# Patient Record
Sex: Female | Born: 1960 | Race: White | Hispanic: No | Marital: Married | Smoking: Never smoker
Health system: Southern US, Community
[De-identification: ages and names within clinical notes are randomized; demographics above are authoritative.]

## PROBLEM LIST (undated history)

## (undated) DIAGNOSIS — T7840XA Allergy, unspecified, initial encounter: Secondary | ICD-10-CM

## (undated) DIAGNOSIS — C801 Malignant (primary) neoplasm, unspecified: Secondary | ICD-10-CM

## (undated) DIAGNOSIS — E785 Hyperlipidemia, unspecified: Secondary | ICD-10-CM

## (undated) DIAGNOSIS — Z923 Personal history of irradiation: Secondary | ICD-10-CM

## (undated) DIAGNOSIS — Z9221 Personal history of antineoplastic chemotherapy: Secondary | ICD-10-CM

## (undated) DIAGNOSIS — Z01419 Encounter for gynecological examination (general) (routine) without abnormal findings: Secondary | ICD-10-CM

## (undated) DIAGNOSIS — C439 Malignant melanoma of skin, unspecified: Secondary | ICD-10-CM

## (undated) HISTORY — DX: Malignant (primary) neoplasm, unspecified: C80.1

## (undated) HISTORY — DX: Hyperlipidemia, unspecified: E78.5

## (undated) HISTORY — PX: BREAST LUMPECTOMY: SHX2

## (undated) HISTORY — PX: MELANOMA EXCISION: SHX5266

## (undated) HISTORY — DX: Encounter for gynecological examination (general) (routine) without abnormal findings: Z01.419

## (undated) HISTORY — DX: Allergy, unspecified, initial encounter: T78.40XA

## (undated) HISTORY — PX: POLYPECTOMY: SHX149

---
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
## 2008-12-30 ENCOUNTER — Ambulatory Visit: Payer: Self-pay | Admitting: Oncology

## 2008-12-31 ENCOUNTER — Encounter: Admission: RE | Admit: 2008-12-31 | Discharge: 2008-12-31 | Payer: Self-pay | Admitting: Radiology

## 2008-12-31 LAB — CBC WITH DIFFERENTIAL/PLATELET
Basophils Absolute: 0 10*3/uL (ref 0.0–0.1)
EOS%: 1.8 % (ref 0.0–7.0)
HCT: 38.6 % (ref 34.8–46.6)
HGB: 13.2 g/dL (ref 11.6–15.9)
MCH: 31.5 pg (ref 25.1–34.0)
NEUT%: 74.4 % (ref 38.4–76.8)
Platelets: 299 10*3/uL (ref 145–400)
lymph#: 1.6 10*3/uL (ref 0.9–3.3)

## 2009-01-01 ENCOUNTER — Encounter: Payer: Self-pay | Admitting: Oncology

## 2009-01-01 ENCOUNTER — Ambulatory Visit: Admission: RE | Admit: 2009-01-01 | Discharge: 2009-01-01 | Payer: Self-pay | Admitting: Oncology

## 2009-01-01 LAB — LACTATE DEHYDROGENASE: LDH: 114 U/L (ref 94–250)

## 2009-01-01 LAB — VITAMIN D 25 HYDROXY (VIT D DEFICIENCY, FRACTURES): Vit D, 25-Hydroxy: 38 ng/mL (ref 30–89)

## 2009-01-01 LAB — COMPREHENSIVE METABOLIC PANEL
AST: 15 U/L (ref 0–37)
BUN: 15 mg/dL (ref 6–23)
CO2: 25 mEq/L (ref 19–32)
Calcium: 9.9 mg/dL (ref 8.4–10.5)
Chloride: 104 mEq/L (ref 96–112)
Creatinine, Ser: 0.97 mg/dL (ref 0.40–1.20)

## 2009-01-06 ENCOUNTER — Ambulatory Visit (HOSPITAL_COMMUNITY): Admission: RE | Admit: 2009-01-06 | Discharge: 2009-01-06 | Payer: Self-pay | Admitting: Oncology

## 2009-01-13 ENCOUNTER — Ambulatory Visit (HOSPITAL_COMMUNITY): Admission: RE | Admit: 2009-01-13 | Discharge: 2009-01-13 | Payer: Self-pay | Admitting: General Surgery

## 2009-01-26 ENCOUNTER — Ambulatory Visit: Payer: Self-pay | Admitting: Oncology

## 2009-01-28 LAB — MORPHOLOGY

## 2009-01-28 LAB — CBC WITH DIFFERENTIAL/PLATELET
BASO%: 2.9 % — ABNORMAL HIGH (ref 0.0–2.0)
Eosinophils Absolute: 0.2 10*3/uL (ref 0.0–0.5)
LYMPH%: 45.4 % (ref 14.0–49.7)
MCHC: 33.4 g/dL (ref 31.5–36.0)
MCV: 91.4 fL (ref 79.5–101.0)
MONO%: 3.4 % (ref 0.0–14.0)
NEUT#: 0.6 10*3/uL — ABNORMAL LOW (ref 1.5–6.5)
Platelets: 133 10*3/uL — ABNORMAL LOW (ref 145–400)
RBC: 4.19 10*6/uL (ref 3.70–5.45)
RDW: 12 % (ref 11.2–14.5)
WBC: 1.7 10*3/uL — ABNORMAL LOW (ref 3.9–10.3)

## 2009-02-04 LAB — CBC WITH DIFFERENTIAL/PLATELET
Basophils Absolute: 0.1 10*3/uL (ref 0.0–0.1)
Eosinophils Absolute: 0 10*3/uL (ref 0.0–0.5)
HCT: 38.4 % (ref 34.8–46.6)
HGB: 13 g/dL (ref 11.6–15.9)
LYMPH%: 18.8 % (ref 14.0–49.7)
MCH: 30.7 pg (ref 25.1–34.0)
MCV: 90.8 fL (ref 79.5–101.0)
MONO%: 7 % (ref 0.0–14.0)
NEUT#: 7.5 10*3/uL — ABNORMAL HIGH (ref 1.5–6.5)
NEUT%: 72.9 % (ref 38.4–76.8)
Platelets: 198 10*3/uL (ref 145–400)
RDW: 12.2 % (ref 11.2–14.5)

## 2009-02-04 LAB — COMPREHENSIVE METABOLIC PANEL
AST: 18 U/L (ref 0–37)
Alkaline Phosphatase: 136 U/L — ABNORMAL HIGH (ref 39–117)
BUN: 18 mg/dL (ref 6–23)
Creatinine, Ser: 0.97 mg/dL (ref 0.40–1.20)
Glucose, Bld: 95 mg/dL (ref 70–99)
Total Bilirubin: 0.3 mg/dL (ref 0.3–1.2)

## 2009-02-11 LAB — CBC WITH DIFFERENTIAL/PLATELET
BASO%: 3.8 % — ABNORMAL HIGH (ref 0.0–2.0)
Eosinophils Absolute: 0 10*3/uL (ref 0.0–0.5)
HCT: 33 % — ABNORMAL LOW (ref 34.8–46.6)
LYMPH%: 36.4 % (ref 14.0–49.7)
MCHC: 35.2 g/dL (ref 31.5–36.0)
MCV: 90.8 fL (ref 79.5–101.0)
MONO#: 0.1 10*3/uL (ref 0.1–0.9)
MONO%: 4.6 % (ref 0.0–14.0)
NEUT%: 54.9 % (ref 38.4–76.8)
Platelets: 149 10*3/uL (ref 145–400)
WBC: 1.6 10*3/uL — ABNORMAL LOW (ref 3.9–10.3)

## 2009-02-18 LAB — CBC WITH DIFFERENTIAL/PLATELET
BASO%: 0.1 % (ref 0.0–2.0)
EOS%: 0.2 % (ref 0.0–7.0)
HCT: 32.3 % — ABNORMAL LOW (ref 34.8–46.6)
LYMPH%: 11 % — ABNORMAL LOW (ref 14.0–49.7)
MCH: 31.8 pg (ref 25.1–34.0)
MCHC: 34.7 g/dL (ref 31.5–36.0)
NEUT%: 83 % — ABNORMAL HIGH (ref 38.4–76.8)
Platelets: 263 10*3/uL (ref 145–400)

## 2009-02-18 LAB — COMPREHENSIVE METABOLIC PANEL
ALT: 42 U/L — ABNORMAL HIGH (ref 0–35)
AST: 26 U/L (ref 0–37)
Alkaline Phosphatase: 160 U/L — ABNORMAL HIGH (ref 39–117)
Creatinine, Ser: 1.01 mg/dL (ref 0.40–1.20)
Total Bilirubin: 0.2 mg/dL — ABNORMAL LOW (ref 0.3–1.2)

## 2009-02-19 ENCOUNTER — Ambulatory Visit: Payer: Self-pay | Admitting: Oncology

## 2009-02-25 LAB — CBC WITH DIFFERENTIAL/PLATELET
Eosinophils Absolute: 0 10*3/uL (ref 0.0–0.5)
HCT: 30.6 % — ABNORMAL LOW (ref 34.8–46.6)
LYMPH%: 28.1 % (ref 14.0–49.7)
MCV: 91.4 fL (ref 79.5–101.0)
MONO#: 0 10*3/uL — ABNORMAL LOW (ref 0.1–0.9)
MONO%: 1.9 % (ref 0.0–14.0)
NEUT#: 1.1 10*3/uL — ABNORMAL LOW (ref 1.5–6.5)
NEUT%: 67.5 % (ref 38.4–76.8)
Platelets: 155 10*3/uL (ref 145–400)
WBC: 1.6 10*3/uL — ABNORMAL LOW (ref 3.9–10.3)

## 2009-03-04 LAB — CBC WITH DIFFERENTIAL/PLATELET
BASO%: 0.2 % (ref 0.0–2.0)
EOS%: 0.2 % (ref 0.0–7.0)
HCT: 31.1 % — ABNORMAL LOW (ref 34.8–46.6)
LYMPH%: 8.1 % — ABNORMAL LOW (ref 14.0–49.7)
MCH: 31.6 pg (ref 25.1–34.0)
MCHC: 34.3 g/dL (ref 31.5–36.0)
MCV: 92.1 fL (ref 79.5–101.0)
MONO#: 0.5 10*3/uL (ref 0.1–0.9)
MONO%: 3.1 % (ref 0.0–14.0)
NEUT%: 88.4 % — ABNORMAL HIGH (ref 38.4–76.8)
Platelets: 272 10*3/uL (ref 145–400)

## 2009-03-04 LAB — COMPREHENSIVE METABOLIC PANEL
ALT: 45 U/L — ABNORMAL HIGH (ref 0–35)
Alkaline Phosphatase: 158 U/L — ABNORMAL HIGH (ref 39–117)
CO2: 22 mEq/L (ref 19–32)
Creatinine, Ser: 0.88 mg/dL (ref 0.40–1.20)
Total Bilirubin: 0.3 mg/dL (ref 0.3–1.2)

## 2009-03-11 ENCOUNTER — Ambulatory Visit (HOSPITAL_COMMUNITY): Admission: RE | Admit: 2009-03-11 | Discharge: 2009-03-11 | Payer: Self-pay | Admitting: Oncology

## 2009-03-11 LAB — CBC WITH DIFFERENTIAL/PLATELET
BASO%: 2 % (ref 0.0–2.0)
Eosinophils Absolute: 0 10*3/uL (ref 0.0–0.5)
HCT: 26.9 % — ABNORMAL LOW (ref 34.8–46.6)
LYMPH%: 16.8 % (ref 14.0–49.7)
MCHC: 34.7 g/dL (ref 31.5–36.0)
MONO#: 0.1 10*3/uL (ref 0.1–0.9)
NEUT%: 78.2 % — ABNORMAL HIGH (ref 38.4–76.8)
Platelets: 162 10*3/uL (ref 145–400)
WBC: 2.5 10*3/uL — ABNORMAL LOW (ref 3.9–10.3)

## 2009-03-18 LAB — CBC WITH DIFFERENTIAL/PLATELET
BASO%: 0 % (ref 0.0–2.0)
EOS%: 0 % (ref 0.0–7.0)
HCT: 29.5 % — ABNORMAL LOW (ref 34.8–46.6)
LYMPH%: 2.5 % — ABNORMAL LOW (ref 14.0–49.7)
MCH: 32.7 pg (ref 25.1–34.0)
MCHC: 34.3 g/dL (ref 31.5–36.0)
MONO%: 3.1 % (ref 0.0–14.0)
NEUT%: 94.4 % — ABNORMAL HIGH (ref 38.4–76.8)
Platelets: 261 10*3/uL (ref 145–400)

## 2009-03-18 LAB — COMPREHENSIVE METABOLIC PANEL
ALT: 42 U/L — ABNORMAL HIGH (ref 0–35)
AST: 21 U/L (ref 0–37)
Creatinine, Ser: 0.93 mg/dL (ref 0.40–1.20)
Total Bilirubin: 0.3 mg/dL (ref 0.3–1.2)

## 2009-03-23 ENCOUNTER — Ambulatory Visit: Payer: Self-pay | Admitting: Oncology

## 2009-03-25 LAB — CBC WITH DIFFERENTIAL/PLATELET
Basophils Absolute: 0 10*3/uL (ref 0.0–0.1)
EOS%: 0.1 % (ref 0.0–7.0)
Eosinophils Absolute: 0 10*3/uL (ref 0.0–0.5)
HGB: 10.6 g/dL — ABNORMAL LOW (ref 11.6–15.9)
MCH: 32.8 pg (ref 25.1–34.0)
NEUT#: 42.5 10*3/uL — ABNORMAL HIGH (ref 1.5–6.5)
RBC: 3.22 10*6/uL — ABNORMAL LOW (ref 3.70–5.45)
RDW: 19.7 % — ABNORMAL HIGH (ref 11.2–14.5)
lymph#: 2.4 10*3/uL (ref 0.9–3.3)

## 2009-04-01 LAB — CBC WITH DIFFERENTIAL/PLATELET
Eosinophils Absolute: 0 10*3/uL (ref 0.0–0.5)
HGB: 10.5 g/dL — ABNORMAL LOW (ref 11.6–15.9)
MCV: 96 fL (ref 79.5–101.0)
MONO#: 0.4 10*3/uL (ref 0.1–0.9)
MONO%: 1.6 % (ref 0.0–14.0)
NEUT#: 25.1 10*3/uL — ABNORMAL HIGH (ref 1.5–6.5)
RBC: 3.22 10*6/uL — ABNORMAL LOW (ref 3.70–5.45)
RDW: 19.6 % — ABNORMAL HIGH (ref 11.2–14.5)
WBC: 26.3 10*3/uL — ABNORMAL HIGH (ref 3.9–10.3)
lymph#: 0.7 10*3/uL — ABNORMAL LOW (ref 0.9–3.3)

## 2009-04-01 LAB — COMPREHENSIVE METABOLIC PANEL
Albumin: 4.5 g/dL (ref 3.5–5.2)
Alkaline Phosphatase: 228 U/L — ABNORMAL HIGH (ref 39–117)
Calcium: 9.3 mg/dL (ref 8.4–10.5)
Chloride: 104 mEq/L (ref 96–112)
Glucose, Bld: 125 mg/dL — ABNORMAL HIGH (ref 70–99)
Potassium: 3.9 mEq/L (ref 3.5–5.3)
Sodium: 139 mEq/L (ref 135–145)
Total Protein: 6.8 g/dL (ref 6.0–8.3)

## 2009-04-08 LAB — CBC WITH DIFFERENTIAL/PLATELET
EOS%: 0.9 % (ref 0.0–7.0)
MCH: 33.1 pg (ref 25.1–34.0)
MCV: 95.2 fL (ref 79.5–101.0)
MONO%: 4.9 % (ref 0.0–14.0)
NEUT#: 0.4 10*3/uL — CL (ref 1.5–6.5)
RBC: 2.64 10*6/uL — ABNORMAL LOW (ref 3.70–5.45)
RDW: 19.7 % — ABNORMAL HIGH (ref 11.2–14.5)

## 2009-04-15 LAB — CBC WITH DIFFERENTIAL/PLATELET
Eosinophils Absolute: 0 10*3/uL (ref 0.0–0.5)
MONO#: 0.6 10*3/uL (ref 0.1–0.9)
NEUT#: 1.5 10*3/uL (ref 1.5–6.5)
RBC: 3.28 10*6/uL — ABNORMAL LOW (ref 3.70–5.45)
RDW: 17 % — ABNORMAL HIGH (ref 11.2–14.5)
WBC: 3.2 10*3/uL — ABNORMAL LOW (ref 3.9–10.3)
lymph#: 1 10*3/uL (ref 0.9–3.3)
nRBC: 0 % (ref 0–0)

## 2009-04-17 ENCOUNTER — Ambulatory Visit: Payer: Self-pay | Admitting: Oncology

## 2009-04-22 ENCOUNTER — Ambulatory Visit (HOSPITAL_COMMUNITY): Admission: RE | Admit: 2009-04-22 | Discharge: 2009-04-22 | Payer: Self-pay | Admitting: Oncology

## 2009-04-22 LAB — CBC WITH DIFFERENTIAL/PLATELET
BASO%: 0.7 % (ref 0.0–2.0)
EOS%: 0.6 % (ref 0.0–7.0)
HCT: 33 % — ABNORMAL LOW (ref 34.8–46.6)
LYMPH%: 15.6 % (ref 14.0–49.7)
MCH: 31.8 pg (ref 25.1–34.0)
MCHC: 33.5 g/dL (ref 31.5–36.0)
MCV: 94.8 fL (ref 79.5–101.0)
MONO%: 7.7 % (ref 0.0–14.0)
NEUT%: 75.4 % (ref 38.4–76.8)
Platelets: 290 10*3/uL (ref 145–400)

## 2009-04-22 LAB — COMPREHENSIVE METABOLIC PANEL
ALT: 20 U/L (ref 0–35)
AST: 17 U/L (ref 0–37)
Creatinine, Ser: 0.69 mg/dL (ref 0.40–1.20)
Sodium: 139 mEq/L (ref 135–145)
Total Bilirubin: 0.5 mg/dL (ref 0.3–1.2)

## 2009-04-29 LAB — CBC WITH DIFFERENTIAL/PLATELET
BASO%: 1.3 % (ref 0.0–2.0)
EOS%: 5.1 % (ref 0.0–7.0)
HCT: 29.6 % — ABNORMAL LOW (ref 34.8–46.6)
MCH: 32.2 pg (ref 25.1–34.0)
MCHC: 34.1 g/dL (ref 31.5–36.0)
NEUT%: 66.6 % (ref 38.4–76.8)
RDW: 16.7 % — ABNORMAL HIGH (ref 11.2–14.5)
lymph#: 0.7 10*3/uL — ABNORMAL LOW (ref 0.9–3.3)

## 2009-05-06 LAB — CBC WITH DIFFERENTIAL/PLATELET
BASO%: 0.7 % (ref 0.0–2.0)
Basophils Absolute: 0 10*3/uL (ref 0.0–0.1)
EOS%: 4.3 % (ref 0.0–7.0)
HGB: 10 g/dL — ABNORMAL LOW (ref 11.6–15.9)
MCH: 30.2 pg (ref 25.1–34.0)
MCHC: 31.6 g/dL (ref 31.5–36.0)
RDW: 15.6 % — ABNORMAL HIGH (ref 11.2–14.5)
lymph#: 0.8 10*3/uL — ABNORMAL LOW (ref 0.9–3.3)

## 2009-05-18 ENCOUNTER — Ambulatory Visit: Payer: Self-pay | Admitting: Oncology

## 2009-05-20 LAB — COMPREHENSIVE METABOLIC PANEL
Albumin: 4 g/dL (ref 3.5–5.2)
BUN: 14 mg/dL (ref 6–23)
Calcium: 9.1 mg/dL (ref 8.4–10.5)
Chloride: 106 mEq/L (ref 96–112)
Glucose, Bld: 74 mg/dL (ref 70–99)
Potassium: 4.3 mEq/L (ref 3.5–5.3)

## 2009-05-20 LAB — CBC WITH DIFFERENTIAL/PLATELET
Basophils Absolute: 0 10*3/uL (ref 0.0–0.1)
Eosinophils Absolute: 0.1 10*3/uL (ref 0.0–0.5)
HGB: 10.9 g/dL — ABNORMAL LOW (ref 11.6–15.9)
MONO#: 0.5 10*3/uL (ref 0.1–0.9)
NEUT#: 3.7 10*3/uL (ref 1.5–6.5)
RDW: 15.9 % — ABNORMAL HIGH (ref 11.2–14.5)
lymph#: 0.8 10*3/uL — ABNORMAL LOW (ref 0.9–3.3)

## 2009-05-25 ENCOUNTER — Ambulatory Visit (HOSPITAL_COMMUNITY): Admission: RE | Admit: 2009-05-25 | Discharge: 2009-05-25 | Payer: Self-pay | Admitting: Oncology

## 2009-06-03 ENCOUNTER — Ambulatory Visit (HOSPITAL_BASED_OUTPATIENT_CLINIC_OR_DEPARTMENT_OTHER): Admission: RE | Admit: 2009-06-03 | Discharge: 2009-06-03 | Payer: Self-pay | Admitting: General Surgery

## 2009-06-03 DIAGNOSIS — C801 Malignant (primary) neoplasm, unspecified: Secondary | ICD-10-CM

## 2009-06-03 HISTORY — DX: Malignant (primary) neoplasm, unspecified: C80.1

## 2009-06-03 HISTORY — PX: BREAST SURGERY: SHX581

## 2009-06-17 ENCOUNTER — Ambulatory Visit: Admission: RE | Admit: 2009-06-17 | Discharge: 2009-09-14 | Payer: Self-pay | Admitting: Radiation Oncology

## 2009-06-18 ENCOUNTER — Ambulatory Visit: Payer: Self-pay | Admitting: Oncology

## 2009-06-19 LAB — CBC WITH DIFFERENTIAL/PLATELET
BASO%: 1.1 % (ref 0.0–2.0)
Basophils Absolute: 0.1 10*3/uL (ref 0.0–0.1)
EOS%: 4.1 % (ref 0.0–7.0)
HCT: 36.3 % (ref 34.8–46.6)
HGB: 11.9 g/dL (ref 11.6–15.9)
MCH: 29.2 pg (ref 25.1–34.0)
MONO#: 0.4 10*3/uL (ref 0.1–0.9)
RDW: 14.4 % (ref 11.2–14.5)
WBC: 4.6 10*3/uL (ref 3.9–10.3)
lymph#: 1.2 10*3/uL (ref 0.9–3.3)

## 2009-06-19 LAB — COMPREHENSIVE METABOLIC PANEL
ALT: 24 U/L (ref 0–35)
AST: 24 U/L (ref 0–37)
Albumin: 4.2 g/dL (ref 3.5–5.2)
BUN: 16 mg/dL (ref 6–23)
CO2: 29 mEq/L (ref 19–32)
Calcium: 9.3 mg/dL (ref 8.4–10.5)
Chloride: 105 mEq/L (ref 96–112)
Potassium: 3.7 mEq/L (ref 3.5–5.3)

## 2009-07-14 ENCOUNTER — Ambulatory Visit (HOSPITAL_COMMUNITY): Admission: RE | Admit: 2009-07-14 | Discharge: 2009-07-15 | Payer: Self-pay | Admitting: General Surgery

## 2009-07-14 ENCOUNTER — Encounter (INDEPENDENT_AMBULATORY_CARE_PROVIDER_SITE_OTHER): Payer: Self-pay | Admitting: General Surgery

## 2009-08-13 ENCOUNTER — Ambulatory Visit: Payer: Self-pay | Admitting: Oncology

## 2009-08-13 LAB — COMPREHENSIVE METABOLIC PANEL
ALT: 25 U/L (ref 0–35)
AST: 23 U/L (ref 0–37)
Albumin: 4.6 g/dL (ref 3.5–5.2)
Alkaline Phosphatase: 127 U/L — ABNORMAL HIGH (ref 39–117)
Calcium: 9.8 mg/dL (ref 8.4–10.5)
Chloride: 105 mEq/L (ref 96–112)
Potassium: 4.2 mEq/L (ref 3.5–5.3)
Sodium: 141 mEq/L (ref 135–145)

## 2009-08-13 LAB — CBC WITH DIFFERENTIAL/PLATELET
BASO%: 0.6 % (ref 0.0–2.0)
EOS%: 2.2 % (ref 0.0–7.0)
HGB: 12.8 g/dL (ref 11.6–15.9)
MCH: 30.1 pg (ref 25.1–34.0)
MCHC: 34.5 g/dL (ref 31.5–36.0)
MONO%: 5.8 % (ref 0.0–14.0)
RBC: 4.24 10*6/uL (ref 3.70–5.45)
RDW: 14.4 % (ref 11.2–14.5)
lymph#: 0.6 10*3/uL — ABNORMAL LOW (ref 0.9–3.3)

## 2009-08-13 LAB — VITAMIN D 25 HYDROXY (VIT D DEFICIENCY, FRACTURES): Vit D, 25-Hydroxy: 52 ng/mL (ref 30–89)

## 2009-08-13 LAB — LACTATE DEHYDROGENASE: LDH: 121 U/L (ref 94–250)

## 2009-09-14 ENCOUNTER — Ambulatory Visit: Admission: RE | Admit: 2009-09-14 | Discharge: 2009-09-23 | Payer: Self-pay | Admitting: Radiation Oncology

## 2009-09-16 ENCOUNTER — Encounter: Payer: Self-pay | Admitting: Family Medicine

## 2009-10-08 ENCOUNTER — Ambulatory Visit: Payer: Self-pay | Admitting: Family Medicine

## 2009-10-08 DIAGNOSIS — D649 Anemia, unspecified: Secondary | ICD-10-CM | POA: Insufficient documentation

## 2009-10-08 DIAGNOSIS — E785 Hyperlipidemia, unspecified: Secondary | ICD-10-CM | POA: Insufficient documentation

## 2009-10-08 DIAGNOSIS — Z853 Personal history of malignant neoplasm of breast: Secondary | ICD-10-CM

## 2009-10-08 LAB — CONVERTED CEMR LAB
Blood in Urine, dipstick: NEGATIVE
Glucose, Urine, Semiquant: NEGATIVE
Nitrite: NEGATIVE
Protein, U semiquant: NEGATIVE
Urobilinogen, UA: 0.2
WBC Urine, dipstick: NEGATIVE

## 2009-10-09 LAB — CONVERTED CEMR LAB
ALT: 26 units/L (ref 0–35)
AST: 26 units/L (ref 0–37)
Albumin: 4.3 g/dL (ref 3.5–5.2)
Alkaline Phosphatase: 107 units/L (ref 39–117)
Basophils Absolute: 0 10*3/uL (ref 0.0–0.1)
Calcium: 9.4 mg/dL (ref 8.4–10.5)
Eosinophils Relative: 1.6 % (ref 0.0–5.0)
GFR calc non Af Amer: 85.7 mL/min (ref >60)
Glucose, Bld: 96 mg/dL (ref 70–99)
HCT: 34.7 % — ABNORMAL LOW (ref 36.0–46.0)
HDL: 56.9 mg/dL (ref >39.00)
Hemoglobin: 12 g/dL (ref 12.0–15.0)
Lymphocytes Relative: 38.6 % (ref 12.0–46.0)
Lymphs Abs: 1.3 10*3/uL (ref 0.7–4.0)
Monocytes Relative: 8.8 % (ref 3.0–12.0)
Neutro Abs: 1.7 10*3/uL (ref 1.4–7.7)
Potassium: 4.2 meq/L (ref 3.5–5.1)
RDW: 14.3 % (ref 11.5–14.6)
Sodium: 141 meq/L (ref 135–145)
Total Bilirubin: 0.7 mg/dL (ref 0.3–1.2)
Total CHOL/HDL Ratio: 4
Triglycerides: 98 mg/dL (ref 0.0–149.0)
VLDL: 19.6 mg/dL (ref 0.0–40.0)
WBC: 3.4 10*3/uL — ABNORMAL LOW (ref 4.5–10.5)

## 2009-10-29 ENCOUNTER — Encounter (INDEPENDENT_AMBULATORY_CARE_PROVIDER_SITE_OTHER): Payer: Self-pay | Admitting: *Deleted

## 2009-11-23 ENCOUNTER — Encounter (INDEPENDENT_AMBULATORY_CARE_PROVIDER_SITE_OTHER): Payer: Self-pay | Admitting: *Deleted

## 2009-11-25 ENCOUNTER — Ambulatory Visit: Payer: Self-pay | Admitting: Gastroenterology

## 2009-12-09 ENCOUNTER — Ambulatory Visit: Payer: Self-pay | Admitting: Oncology

## 2009-12-11 LAB — CBC WITH DIFFERENTIAL/PLATELET
BASO%: 0.7 % (ref 0.0–2.0)
HCT: 34.3 % — ABNORMAL LOW (ref 34.8–46.6)
LYMPH%: 26.1 % (ref 14.0–49.7)
MCH: 31.7 pg (ref 25.1–34.0)
MCHC: 35 g/dL (ref 31.5–36.0)
MONO#: 0.3 10*3/uL (ref 0.1–0.9)
NEUT%: 63.8 % (ref 38.4–76.8)
Platelets: 212 10*3/uL (ref 145–400)

## 2009-12-12 LAB — COMPREHENSIVE METABOLIC PANEL
ALT: 31 U/L (ref 0–35)
BUN: 16 mg/dL (ref 6–23)
CO2: 22 mEq/L (ref 19–32)
Creatinine, Ser: 0.95 mg/dL (ref 0.40–1.20)
Total Bilirubin: 0.4 mg/dL (ref 0.3–1.2)

## 2009-12-12 LAB — FOLLICLE STIMULATING HORMONE: FSH: 87.3 m[IU]/mL

## 2009-12-12 LAB — VITAMIN D 25 HYDROXY (VIT D DEFICIENCY, FRACTURES): Vit D, 25-Hydroxy: 58 ng/mL (ref 30–89)

## 2009-12-12 LAB — CANCER ANTIGEN 27.29: CA 27.29: 24 U/mL (ref 0–39)

## 2009-12-18 ENCOUNTER — Encounter: Payer: Self-pay | Admitting: Family Medicine

## 2009-12-18 LAB — ESTRADIOL, ULTRA SENS: Estradiol, Ultra Sensitive: 3 pg/mL

## 2010-01-01 ENCOUNTER — Encounter: Admission: RE | Admit: 2010-01-01 | Discharge: 2010-01-01 | Payer: Self-pay | Admitting: General Surgery

## 2010-01-12 ENCOUNTER — Ambulatory Visit: Payer: Self-pay | Admitting: Gastroenterology

## 2010-01-12 HISTORY — PX: COLONOSCOPY: SHX174

## 2010-03-23 NOTE — Letter (Signed)
Summary: Pre Visit Letter Revised  [facility]  [address] [place], [place] [address]   Phone: [phone]  Fax: [phone]        10/29/2009 MRN: [id number] Melinda Webster [address] [place], [place]  [address]             Procedure Date:  12-09-09  Welcome to the Gastroenterology Division at [facility].    You are scheduled to see a nurse for your pre-procedure visit on 11-25-09 at 8:00a.m.on the 3rd floor at [facility], [address]  We ask that you try to arrive at our office 15 minutes prior to your appointment time to allow for check-in.  Please take a minute to review the attached form.  If you answer "Yes" to one or more of the questions on the first page, we ask that you call the person listed at your earliest opportunity.  If you answer "No" to all of the questions, please complete the rest of the form and bring it to your appointment.    Your nurse visit will consist of discussing your medical and surgical history, your immediate family medical history, and your medications.   If you are unable to list all of your medications on the form, please bring the medication bottles to your appointment and we will list them.  We will need to be aware of both prescribed and over the counter drugs.  We will need to know exact dosage information as well.    Please be prepared to read and sign documents such as consent forms, a financial agreement, and acknowledgement forms.  If necessary, and with your consent, a friend or relative is welcome to sit-in on the nurse visit with you.  Please bring your insurance card so that we may make a copy of it.  If your insurance requires a referral to see a specialist, please bring your referral form from your primary care physician.  No co-pay is required for this nurse visit.     If you cannot keep your appointment, please call [phone] to cancel or reschedule prior to your appointment date.  This allows  [place] the opportunity to schedule an appointment for another patient in need of care.    Thank you for choosing [facility] for your medical needs.  We appreciate the opportunity to care for you.  Please visit [place] at our website  to learn more about our practice.  Sincerely, The Gastroenterology Division

## 2010-03-23 NOTE — Letter (Signed)
Summary: Letter from patient-personal history  Letter from patient-personal history   Imported By: Maryln Gottron 10/12/2009 11:27:40  _____________________________________________________________________  External Attachment:    Type:   Image     Comment:   External Document

## 2010-03-23 NOTE — Letter (Signed)
Summary: [facility]  [facility]   Imported By: Maryln Gottron 01/12/2010 12:43:59  _____________________________________________________________________  External Attachment:    Type:   Image     Comment:   External Document

## 2010-03-23 NOTE — Assessment & Plan Note (Signed)
Summary: BRAND NEW PT/TO EST/PT REQ CPX/PT COMING IN FASTING/CJR   Vital Signs:  Patient profile:   50 year old female Height:      62 inches Weight:      126 pounds BMI:     23.13 Pulse rate:   64 / minute Pulse rhythm:   regular BP sitting:   92 / 64  (left arm) Cuff size:   regular  Vitals Entered By: Raechel Ache, RN (October 08, 2009 9:51 AM) CC: New CPX, fasting. Sees gyn.   History of Present Illness: 50 yr old female to establish with [place] and for a cpx. She has not had a primary care doctor before. She has no concerns today. Her major health issue of course is the breast cancer that was discovered late last year. As noted below, she had a lumpectomy, has finished chemotherapy, and has finished radiation therapy as well. 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 Her feet burn at night. She takes OTC B12 supplements, and this helps a lot. Appetite is good, gets some exercise.   Preventive Screening-Counseling & Management  Alcohol-Tobacco     Smoking Status: never  Caffeine-Diet-Exercise     Does Patient Exercise: yes      Drug Use:  no.    Allergies (verified): 1)  ! Lipitor  Past History:  Past Medical History: Anemia-NOS Breast cancer, hx of Hyperlipidemia sees Dr. Malva Limes for GYN care sees Dr. Pierce [name] for Oncology care, had chemotherapy now on tamoxifen sees Dr. Mitzi Hansen for radiation oncology  Past Surgical History: Caesarean section Lumpectomy right breast April 2011 per Dr. Carolynne Edouard right axillary lymph node resection May 2011 per Dr. Carolynne Edouard  Family History: Reviewed history and no changes required. Family History of Colon CA 1st degree relative <60  Social History: Reviewed history and no changes required. Occupation: Print production planner Divorced Never Smoked Alcohol use-yes Drug use-no Regular exercise-yes Occupation:  employed Smoking Status:  never Drug Use:  no Does Patient Exercise:   yes  Review of Systems  The patient denies anorexia, fever, weight loss, weight gain, vision loss, decreased hearing, hoarseness, chest pain, syncope, dyspnea on exertion, peripheral edema, prolonged cough, headaches, hemoptysis, abdominal pain, melena, hematochezia, severe indigestion/heartburn, hematuria, incontinence, genital sores, muscle weakness, suspicious skin lesions, transient blindness, difficulty walking, depression, unusual weight change, abnormal bleeding, enlarged lymph nodes, angioedema, breast masses, and testicular masses.    Physical Exam  General:  Well-developed,well-nourished,in no acute distress; alert,appropriate and cooperative throughout examination Head:  Normocephalic and atraumatic without obvious abnormalities. No apparent alopecia or balding. Eyes:  No corneal or conjunctival inflammation noted. EOMI. Perrla. Funduscopic exam benign, without hemorrhages, exudates or papilledema. Vision grossly normal. Ears:  External ear exam shows no significant lesions or deformities.  Otoscopic examination reveals clear canals, tympanic membranes are intact bilaterally without bulging, retraction, inflammation or discharge. Hearing is grossly normal bilaterally. Nose:  External nasal examination shows no deformity or inflammation. Nasal mucosa are pink and moist without lesions or exudates. Mouth:  Oral mucosa and oropharynx without lesions or exudates.  Teeth in good repair. Neck:  No deformities, masses, or tenderness noted. Chest Wall:  No deformities, masses, or tenderness noted. Lungs:  Normal respiratory effort, chest expands symmetrically. Lungs are clear to auscultation, no crackles or wheezes. Heart:  Normal rate and regular rhythm. S1 and S2 normal without gallop, murmur, click, rub or other extra sounds. Abdomen:  Bowel sounds positive,abdomen soft and non-tender without  masses, organomegaly or hernias noted. Msk:  No deformity or scoliosis noted of thoracic or lumbar  spine.   Pulses:  R and L carotid,radial,femoral,dorsalis pedis and posterior tibial pulses are full and equal bilaterally Extremities:  No clubbing, cyanosis, edema, or deformity noted with normal full range of motion of all joints.   Neurologic:  No cranial nerve deficits noted. Station and gait are normal. Plantar reflexes are down-going bilaterally. DTRs are symmetrical throughout. Sensory, motor and coordinative functions appear intact. Skin:  Intact without suspicious lesions or rashes Cervical Nodes:  No lymphadenopathy noted Axillary Nodes:  No palpable lymphadenopathy Inguinal Nodes:  No significant adenopathy Psych:  Cognition and judgment appear intact. Alert and cooperative with normal attention span and concentration. No apparent delusions, illusions, hallucinations   Impression & Recommendations:  Problem # 1:  HEALTH MAINTENANCE EXAM (ICD-V70.0)  Orders: UA Dipstick w/o Micro (automated)  (81003) Venipuncture (59563) TLB-Lipid Panel (80061-LIPID) TLB-BMP (Basic Metabolic Panel-BMET) (80048-METABOL) TLB-CBC Platelet - w/Differential (85025-CBCD) TLB-Hepatic/Liver Function Pnl (80076-HEPATIC) TLB-TSH (Thyroid Stimulating Hormone) (84443-TSH) TLB-B12, Serum-Total ONLY (87564-P32) Specimen Handling (95188) Gastroenterology Referral (GI)  Complete Medication List: 1)  Tamoxifen Citrate 20 Mg Tabs (Tamoxifen citrate) .Marland Kitchen.. 1 once daily 2)  Aspirin 81 Mg Tbec (Aspirin) .... Once daily 3)  Fish Oil 1000 Mg Caps (Omega-3 fatty acids) .... Once daily  Patient Instructions: 1)  Get fasting labs today.  2)  Schedule a colonoscopy/ sigmoidoscopy to help detect colon cancer.    Preventive Care Screening  Pap Smear:    Date:  03/24/2009    Results:  normal   Mammogram:    Date:  12/22/2008    Results:  abnormal    Laboratory Results   Urine Tests    Routine Urinalysis   Color: yellow Appearance: Clear Glucose: negative   (Normal Range: Negative) Bilirubin:  negative   (Normal Range: Negative) Ketone: negative   (Normal Range: Negative) Spec. Gravity: 1.020   (Normal Range: 1.003-1.035) Blood: negative   (Normal Range: Negative) pH: 7.0   (Normal Range: 5.0-8.0) Protein: negative   (Normal Range: Negative) Urobilinogen: 0.2   (Normal Range: 0-1) Nitrite: negative   (Normal Range: Negative) Leukocyte Esterace: negative   (Normal Range: Negative)    Comments: Rita Ohara  October 08, 2009 11:01 AM

## 2010-03-23 NOTE — Letter (Signed)
Summary: [facility] Instructions  [facility]  [address] [place], [place] [address]   Phone: [phone]  Fax: [phone]       MARISE KNAPPER    [birth year]    MRN: [id number]        Procedure Day Dorna Bloom: Wednesday 12-09-09     Arrival Time: 9:00 a.m.     Procedure Time: 10:00 a.m.     Location of Procedure:                    _x _  [facility] (4th Floor)                        PREPARATION FOR COLONOSCOPY WITH MOVIPREP   Starting 5 days prior to your procedure  12-04-09  do not eat nuts, seeds, popcorn, corn, beans, peas,  salads, or any raw vegetables.  Do not take any fiber supplements (e.g. Metamucil, Citrucel, and Benefiber).  THE DAY BEFORE YOUR PROCEDURE         DATE:  12-08-09   DAY: Tuesday  1.  Drink clear liquids the entire day-NO SOLID FOOD  2.  Do not drink anything colored red or purple.  Avoid juices with pulp.  No orange juice.  3.  Drink at least 64 oz. (8 glasses) of fluid/clear liquids during the day to prevent dehydration and help the prep work efficiently.  CLEAR LIQUIDS INCLUDE: Water Jello Ice Popsicles Tea (sugar ok, no milk/cream) Powdered fruit flavored drinks Coffee (sugar ok, no milk/cream) Gatorade Juice: apple, white grape, white cranberry  Lemonade Clear bullion, consomm, broth Carbonated beverages (any kind) Strained chicken noodle soup Hard Candy                             4.  In the morning, mix first dose of MoviPrep solution:    Empty 1 Pouch A and 1 Pouch B into the disposable container    Add lukewarm drinking water to the top line of the container. Mix to dissolve    Refrigerate (mixed solution should be used within 24 hrs)  5.  Begin drinking the prep at 5:00 p.m. The MoviPrep container is divided by 4 marks.   Every 15 minutes drink the solution down to the next mark (approximately 8 oz) until the full liter is complete.   6.  Follow completed prep with 16 oz of clear liquid of your  choice (Nothing red or purple).  Continue to drink clear liquids until bedtime.  7.  Before going to bed, mix second dose of MoviPrep solution:    Empty 1 Pouch A and 1 Pouch B into the disposable container    Add lukewarm drinking water to the top line of the container. Mix to dissolve    Refrigerate  THE DAY OF YOUR PROCEDURE      DATE:  12-09-09  DAY: Wednesday  Beginning at  5:00 a.m. (5 hours before procedure):         1. Every 15 minutes, drink the solution down to the next mark (approx 8 oz) until the full liter is complete.  2. Follow completed prep with 16 oz. of clear liquid of your choice.    3. You may drink clear liquids until  8:00 a.m.  (2 HOURS BEFORE PROCEDURE).   MEDICATION INSTRUCTIONS  Unless otherwise instructed, you should take regular prescription medications with a small sip of water  as early as possible the morning of your procedure.        OTHER INSTRUCTIONS  You will need a responsible adult at least 51 years of age to accompany you and drive you home.   This person must remain in the waiting room during your procedure.  Wear loose fitting clothing that is easily removed.  Leave jewelry and other valuables at home.  However, you may wish to bring a book to read or  an iPod/MP3 player to listen to music as you wait for your procedure to start.  Remove all body piercing jewelry and leave at home.  Total time from sign-in until discharge is approximately 2-3 hours.  You should go home directly after your procedure and rest.  You can resume normal activities the  day after your procedure.  The day of your procedure you should not:   Drive   Make legal decisions   Operate machinery   Drink alcohol   Return to work  You will receive specific instructions about eating, activities and medications before you leave.    The above instructions have been reviewed and explained to me by   Wyona Almas RN  November 25, 2009 8:29  AM     I fully understand and can verbalize these instructions _____________________________ Date _________

## 2010-03-23 NOTE — Procedures (Signed)
Summary: Colonoscopy  Patient: Melinda Webster Note: All result statuses are Final unless otherwise noted.  Tests: (1) Colonoscopy (COL)   COL Colonoscopy           DONE     [facility]     [address]     [place], [place]  [address]           COLONOSCOPY PROCEDURE REPORT           PATIENT:  Melinda Webster, Melinda Webster  MR#:  [id number]     BIRTHDATE:  1960/11/13, 49 yrs. old  GENDER:  female           ENDOSCOPIST:  Barbette Hair. Arlyce Dice, MD     Referred by:  Melinda Webster, M.D.           PROCEDURE DATE:  01/12/2010     PROCEDURE:  Diagnostic Colonoscopy     ASA CLASS:  Class II     INDICATIONS:  1) Elevated Risk Screening  2) family history of     colon cancer Parent           MEDICATIONS:   Fentanyl 50 mcg IV, Versed 8 mg IV           DESCRIPTION OF PROCEDURE:   After the risks benefits and     alternatives of the procedure were thoroughly explained, informed     consent was obtained.  Digital rectal exam was performed and     revealed no abnormalities.   The LB CF-[id number] endoscope     was introduced through the anus and advanced to the cecum, which     was identified by both the appendix and ileocecal valve, without     limitations.  The quality of the prep was excellent, using     MoviPrep.  The instrument was then slowly withdrawn as the colon     was fully examined.     <<PROCEDUREIMAGES>>           FINDINGS:  A normal appearing cecum, ileocecal valve, and     appendiceal orifice were identified. The ascending, hepatic     flexure, transverse, splenic flexure, descending, sigmoid colon,     and rectum appeared unremarkable (see image1, image2, image3,     image4, image9, image10, image12, image14, image16, and image17).     Retroflexed views in the rectum revealed no abnormalities.    The     time to cecum =  2.50  minutes. The scope was then withdrawn (time     =  6.0  min) from the patient and the procedure completed.           COMPLICATIONS:  None        ENDOSCOPIC IMPRESSION:     1) Normal colon     RECOMMENDATIONS:     1) Given your significant family history of colon cancer, you     should have a repeat colonoscopy in 5 years           REPEAT EXAM:   5 year(s) Colonoscopy           ______________________________     Barbette Hair. Arlyce Dice, MD           CC:           n.     eSIGNED:   Barbette Hair. Kameron Blethen at 01/12/2010 12:13 PM           Melinda Webster, [id number]  Note: An exclamation mark Marland Kitchen)  indicates a result that was not dispersed into the flowsheet. Document Creation Date: 01/12/2010 12:14 PM _______________________________________________________________________  (1) Order result status: Final Collection or observation date-time: 01/12/2010 12:10 Requested date-time:  Receipt date-time:  Reported date-time:  Referring Physician:   Ordering Physician: Melinda Webster [phone]) Specimen Source:  Source: Launa Grill Order Number: [id number] Lab site:   Appended Document: Colonoscopy    Clinical Lists Changes  Observations: Added new observation of COLONNXTDUE: 12/2014 (01/12/2010 13:37)

## 2010-03-23 NOTE — Miscellaneous (Signed)
Summary: [facility] Previsit/prep  Clinical Lists Changes  Medications: Added new medication of MOVIPREP 100 GM  SOLR (PEG-KCL-NACL-NASULF-NA ASC-C) As per prep instructions. - Signed Rx of MOVIPREP 100 GM  SOLR (PEG-KCL-NACL-NASULF-NA ASC-C) As per prep instructions.;  #1 x 0;  Signed;  Entered by: Wyona Almas RN;  Authorized by: Louis Meckel MD;  Method used: Electronically to [facility] [address] [id number]*, [address] [place], [place], [place]  [address], Ph: [phone] or [phone], Fax: [phone] Observations: Added new observation of ALLERGY REV: Done (11/25/2009 7:55)    Prescriptions: MOVIPREP 100 GM  SOLR (PEG-KCL-NACL-NASULF-NA ASC-C) As per prep instructions.  #1 x 0   Entered by:   Wyona Almas RN   Authorized by:   Louis Meckel MD   Signed by:   Wyona Almas RN on 11/25/2009   Method used:   Electronically to        [facility] [address] [phone]* (retail)       [address]       [place], [place]  [address]       Ph: [phone] or [phone]       Fax: [phone]   RxID:   [id number]

## 2010-05-10 LAB — CBC
HCT: 36.3 % (ref 36.0–46.0)
Hemoglobin: 12.6 g/dL (ref 12.0–15.0)
Platelets: 198 10*3/uL (ref 150–400)
WBC: 4 10*3/uL (ref 4.0–10.5)

## 2010-05-11 ENCOUNTER — Other Ambulatory Visit: Payer: Self-pay | Admitting: Obstetrics and Gynecology

## 2010-05-12 LAB — COMPREHENSIVE METABOLIC PANEL
AST: 21 U/L (ref 0–37)
Alkaline Phosphatase: 110 U/L (ref 39–117)
BUN: 16 mg/dL (ref 6–23)
CO2: 28 mEq/L (ref 19–32)
Chloride: 104 mEq/L (ref 96–112)
Creatinine, Ser: 0.72 mg/dL (ref 0.4–1.2)
GFR calc Af Amer: >60 mL/min (ref >60)
GFR calc non Af Amer: >60 mL/min (ref >60)
Potassium: 4.5 mEq/L (ref 3.5–5.1)
Total Bilirubin: 0.6 mg/dL (ref 0.3–1.2)

## 2010-05-12 LAB — URINALYSIS, ROUTINE W REFLEX MICROSCOPIC
Bilirubin Urine: NEGATIVE
Hgb urine dipstick: NEGATIVE
Ketones, ur: NEGATIVE mg/dL
Nitrite: NEGATIVE
Protein, ur: NEGATIVE mg/dL
Specific Gravity, Urine: 1.01 (ref 1.005–1.030)
Urobilinogen, UA: 0.2 mg/dL (ref 0.0–1.0)

## 2010-05-12 LAB — DIFFERENTIAL
Basophils Absolute: 0.1 10*3/uL (ref 0.0–0.1)
Basophils Relative: 1 % (ref 0–1)
Eosinophils Relative: 3 % (ref 0–5)
Lymphocytes Relative: 26 % (ref 12–46)

## 2010-05-12 LAB — CBC
Hemoglobin: 12 g/dL (ref 12.0–15.0)
MCHC: 34.9 g/dL (ref 30.0–36.0)
MCV: 91.2 fL (ref 78.0–100.0)
RBC: 3.77 MIL/uL — ABNORMAL LOW (ref 3.87–5.11)

## 2010-05-12 LAB — LACTATE DEHYDROGENASE: LDH: 182 U/L (ref 94–250)

## 2010-05-23 HISTORY — PX: BREAST ENHANCEMENT SURGERY: SHX7

## 2010-05-26 ENCOUNTER — Other Ambulatory Visit: Payer: Self-pay | Admitting: Oncology

## 2010-05-26 ENCOUNTER — Encounter (HOSPITAL_BASED_OUTPATIENT_CLINIC_OR_DEPARTMENT_OTHER): Payer: BC Managed Care – PPO | Admitting: Oncology

## 2010-05-26 DIAGNOSIS — Z170 Estrogen receptor positive status [ER+]: Secondary | ICD-10-CM

## 2010-05-26 DIAGNOSIS — C50519 Malignant neoplasm of lower-outer quadrant of unspecified female breast: Secondary | ICD-10-CM

## 2010-05-26 LAB — CBC WITH DIFFERENTIAL/PLATELET
BASO%: 1.1 % (ref 0.0–2.0)
EOS%: 3.8 % (ref 0.0–7.0)
LYMPH%: 22.6 % (ref 14.0–49.7)
MCH: 31.8 pg (ref 25.1–34.0)
MCHC: 34.7 g/dL (ref 31.5–36.0)
MCV: 91.5 fL (ref 79.5–101.0)
MONO%: 7.6 % (ref 0.0–14.0)
NEUT#: 2.7 10*3/uL (ref 1.5–6.5)
Platelets: 202 10*3/uL (ref 145–400)
RBC: 3.85 10*6/uL (ref 3.70–5.45)
RDW: 13.1 % (ref 11.2–14.5)

## 2010-05-26 LAB — PREGNANCY, URINE: Preg Test, Ur: NEGATIVE

## 2010-05-26 LAB — GLUCOSE, CAPILLARY: Glucose-Capillary: 117 mg/dL — ABNORMAL HIGH (ref 70–99)

## 2010-05-27 LAB — COMPREHENSIVE METABOLIC PANEL
ALT: 18 U/L (ref 0–35)
AST: 21 U/L (ref 0–37)
BUN: 23 mg/dL (ref 6–23)
Calcium: 9.6 mg/dL (ref 8.4–10.5)
Chloride: 103 mEq/L (ref 96–112)
Creatinine, Ser: 1.13 mg/dL (ref 0.40–1.20)
Total Bilirubin: 0.4 mg/dL (ref 0.3–1.2)

## 2010-05-27 LAB — LACTATE DEHYDROGENASE: LDH: 131 U/L (ref 94–250)

## 2010-05-27 LAB — FOLLICLE STIMULATING HORMONE: FSH: 61.5 m[IU]/mL

## 2010-05-27 LAB — VITAMIN D 25 HYDROXY (VIT D DEFICIENCY, FRACTURES): Vit D, 25-Hydroxy: 50 ng/mL (ref 30–89)

## 2010-06-01 ENCOUNTER — Encounter (HOSPITAL_BASED_OUTPATIENT_CLINIC_OR_DEPARTMENT_OTHER): Payer: BC Managed Care – PPO | Admitting: Oncology

## 2010-06-01 ENCOUNTER — Other Ambulatory Visit: Payer: Self-pay | Admitting: Oncology

## 2010-06-01 DIAGNOSIS — Z853 Personal history of malignant neoplasm of breast: Secondary | ICD-10-CM

## 2010-06-01 DIAGNOSIS — Z1231 Encounter for screening mammogram for malignant neoplasm of breast: Secondary | ICD-10-CM

## 2010-06-02 LAB — ESTRADIOL, ULTRA SENS: Estradiol, Ultra Sensitive: <2 pg/mL

## 2010-09-28 ENCOUNTER — Encounter (INDEPENDENT_AMBULATORY_CARE_PROVIDER_SITE_OTHER): Payer: Self-pay | Admitting: General Surgery

## 2010-09-28 ENCOUNTER — Ambulatory Visit (INDEPENDENT_AMBULATORY_CARE_PROVIDER_SITE_OTHER): Payer: BC Managed Care – PPO | Admitting: General Surgery

## 2010-09-28 DIAGNOSIS — C50919 Malignant neoplasm of unspecified site of unspecified female breast: Secondary | ICD-10-CM | POA: Insufficient documentation

## 2010-09-28 NOTE — Progress Notes (Signed)
Subjective:     Patient ID: Melinda Webster, female   DOB: [birth year], 50 y.o.   MRN: [id number]  HPI The patient is a 50 year old white female who is now here in 3 months out from a right lumpectomy and axillary node dissection for a T1 B. N1 A. breast cancer. She received neoadjuvant chemotherapy. She finished radiation and is now taking tamoxifen. She is tolerating this well. Since her last visit she underwent breast reconstruction with implants by Dr. Odis Luster.Her only complaint is of some mild soreness bilaterally. Her last mammogram was in November and showed no evidence of malignancy. Review of Systems     Objective:   Physical Exam On exam Lungs: Clear bilaterally with no use of accessory respiratory muscles Heart: Regular rate and rhythm with an impulse in the left chest Abdomen: Soft and nontender with no palpable mass or hepatosplenomegaly. Breasts: Her implants appear to be intact. No palpable mass in either breast. No axillary supraclavicular or cervical lymphadenopathy on either side. She still has a little bit of firm scar in the 6:00 position of the right breast.     Assessment:     One year and 3 months out from a right lumpectomy and axillary node dissection.    Plan:     She will continue her tamoxifen. She will be seeing Dr. Donnie Coffin in October. She will be due for her next mammogram was in November. We will plan to see her back in about 6 months.

## 2010-09-28 NOTE — Patient Instructions (Signed)
Continue tamoxifen Continue regular self exams 

## 2010-12-23 ENCOUNTER — Encounter (HOSPITAL_BASED_OUTPATIENT_CLINIC_OR_DEPARTMENT_OTHER): Payer: BC Managed Care – PPO | Admitting: Oncology

## 2010-12-23 ENCOUNTER — Other Ambulatory Visit: Payer: Self-pay | Admitting: Oncology

## 2010-12-23 DIAGNOSIS — C50919 Malignant neoplasm of unspecified site of unspecified female breast: Secondary | ICD-10-CM

## 2010-12-23 DIAGNOSIS — Z79810 Long term (current) use of selective estrogen receptor modulators (SERMs): Secondary | ICD-10-CM

## 2010-12-23 DIAGNOSIS — Z170 Estrogen receptor positive status [ER+]: Secondary | ICD-10-CM

## 2010-12-23 DIAGNOSIS — C50519 Malignant neoplasm of lower-outer quadrant of unspecified female breast: Secondary | ICD-10-CM

## 2010-12-23 LAB — CBC WITH DIFFERENTIAL/PLATELET
Basophils Absolute: 0 10*3/uL (ref 0.0–0.1)
Eosinophils Absolute: 0.1 10*3/uL (ref 0.0–0.5)
HGB: 12.5 g/dL (ref 11.6–15.9)
MONO#: 0.2 10*3/uL (ref 0.1–0.9)
NEUT#: 1.7 10*3/uL (ref 1.5–6.5)
RBC: 3.97 10*6/uL (ref 3.70–5.45)
RDW: 12.8 % (ref 11.2–14.5)
WBC: 3.4 10*3/uL — ABNORMAL LOW (ref 3.9–10.3)

## 2010-12-24 ENCOUNTER — Telehealth: Payer: Self-pay | Admitting: *Deleted

## 2010-12-24 LAB — COMPREHENSIVE METABOLIC PANEL
Albumin: 4.6 g/dL (ref 3.5–5.2)
BUN: 18 mg/dL (ref 6–23)
Calcium: 9.9 mg/dL (ref 8.4–10.5)
Chloride: 108 mEq/L (ref 96–112)
Creatinine, Ser: 0.93 mg/dL (ref 0.50–1.10)
Glucose, Bld: 106 mg/dL — ABNORMAL HIGH (ref 70–99)
Potassium: 4.5 mEq/L (ref 3.5–5.3)

## 2010-12-24 LAB — VITAMIN D 25 HYDROXY (VIT D DEFICIENCY, FRACTURES): Vit D, 25-Hydroxy: 50 ng/mL (ref 30–89)

## 2011-01-03 ENCOUNTER — Ambulatory Visit
Admission: RE | Admit: 2011-01-03 | Discharge: 2011-01-03 | Disposition: A | Discharge status: Home / Self Care | Payer: BC Managed Care – PPO | Source: Ambulatory Visit | Attending: Oncology | Admitting: Oncology

## 2011-01-03 DIAGNOSIS — Z853 Personal history of malignant neoplasm of breast: Secondary | ICD-10-CM

## 2011-01-03 DIAGNOSIS — Z1231 Encounter for screening mammogram for malignant neoplasm of breast: Secondary | ICD-10-CM

## 2011-02-01 ENCOUNTER — Other Ambulatory Visit: Payer: Self-pay

## 2011-02-01 DIAGNOSIS — C50919 Malignant neoplasm of unspecified site of unspecified female breast: Secondary | ICD-10-CM

## 2011-02-01 MED ORDER — TAMOXIFEN CITRATE 20 MG PO TABS: 20.0000 mg | ORAL_TABLET | Freq: Every day | ORAL | Status: DC

## 2011-02-16 ENCOUNTER — Telehealth: Payer: Self-pay | Admitting: *Deleted

## 2011-02-16 NOTE — Telephone Encounter (Signed)
left voice message to inform the patient of the new date and time on 05-20-2011 starting at 11:30am 

## 2011-02-16 NOTE — Telephone Encounter (Signed)
patient called back and confirmed over the phone the new date and time on 05-26-2011 at 9:30

## 2011-03-17 ENCOUNTER — Encounter (INDEPENDENT_AMBULATORY_CARE_PROVIDER_SITE_OTHER): Payer: Self-pay | Admitting: General Surgery

## 2011-04-25 ENCOUNTER — Ambulatory Visit (INDEPENDENT_AMBULATORY_CARE_PROVIDER_SITE_OTHER): Payer: BC Managed Care – PPO | Admitting: General Surgery

## 2011-05-05 ENCOUNTER — Encounter (INDEPENDENT_AMBULATORY_CARE_PROVIDER_SITE_OTHER): Payer: Self-pay | Admitting: General Surgery

## 2011-05-09 ENCOUNTER — Encounter (INDEPENDENT_AMBULATORY_CARE_PROVIDER_SITE_OTHER): Payer: Self-pay | Admitting: General Surgery

## 2011-05-09 ENCOUNTER — Ambulatory Visit (INDEPENDENT_AMBULATORY_CARE_PROVIDER_SITE_OTHER): Payer: BC Managed Care – PPO | Admitting: General Surgery

## 2011-05-09 VITALS — BP 106/72 | HR 68 | Temp 97.4°F | Resp 16 | Ht 62.0 in | Wt 130.0 lb

## 2011-05-09 DIAGNOSIS — C50919 Malignant neoplasm of unspecified site of unspecified female breast: Secondary | ICD-10-CM

## 2011-05-09 NOTE — Progress Notes (Signed)
Subjective:     Patient ID: Melinda Webster, female   DOB: Apr 29, 1960, 51 y.o.   MRN: [id number]  HPI The patient is a 51 year old white female who is one year and 9 months out from a right lumpectomy and axillary node dissection for a T1bN1a breast cancer. She had reconstruction was performed by Dr. Odis Luster last summer. She is doing very well and has no complaints today. She is tolerating her tamoxifen well. Her last mammogram was in November that showed no evidence of malignancy.  Review of Systems  Constitutional: Negative.   HENT: Negative.   Eyes: Negative.   Respiratory: Negative.   Cardiovascular: Negative.   Gastrointestinal: Negative.   Genitourinary: Negative.   Musculoskeletal: Negative.   Skin: Negative.   Neurological: Negative.   Hematological: Negative.   Psychiatric/Behavioral: Negative.        Objective:   Physical Exam  Constitutional: She is oriented to person, place, and time. She appears well-developed and well-nourished.  HENT:  Head: Normocephalic and atraumatic.  Eyes: Conjunctivae and EOM are normal. Pupils are equal, round, and reactive to light.  Neck: Normal range of motion. Neck supple.  Cardiovascular: Normal rate, regular rhythm and normal heart sounds.   Pulmonary/Chest: Effort normal and breath sounds normal.       No palpable mass in either augmented breast. No axillary supraclavicular or cervical lymphadenopathy.  Abdominal: Soft. Bowel sounds are normal.  Musculoskeletal: Normal range of motion.  Neurological: She is alert and oriented to person, place, and time.  Skin: Skin is warm and dry.  Psychiatric: She has a normal mood and affect. Her behavior is normal.       Assessment:     One year and 9 months status post right lumpectomy and axillary node dissection    Plan:     She will continue to do regular self exams. She will continue her tamoxifen. We will plan to see her back in about 6 months.

## 2011-05-09 NOTE — Patient Instructions (Signed)
Continue regular self exams Continue tamoxifen 

## 2011-05-10 ENCOUNTER — Other Ambulatory Visit (INDEPENDENT_AMBULATORY_CARE_PROVIDER_SITE_OTHER): Payer: BC Managed Care – PPO

## 2011-05-10 DIAGNOSIS — Z0000 Encounter for general adult medical examination without abnormal findings: Secondary | ICD-10-CM

## 2011-05-10 LAB — LIPID PANEL
Cholesterol: 193 mg/dL (ref 0–200)
LDL Cholesterol: 108 mg/dL — ABNORMAL HIGH (ref 0–99)
Total CHOL/HDL Ratio: 3
Triglycerides: 129 mg/dL (ref 0.0–149.0)
VLDL: 25.8 mg/dL (ref 0.0–40.0)

## 2011-05-10 LAB — CBC WITH DIFFERENTIAL/PLATELET
Basophils Relative: 1.3 % (ref 0.0–3.0)
Eosinophils Absolute: 0.1 10*3/uL (ref 0.0–0.7)
Eosinophils Relative: 3.7 % (ref 0.0–5.0)
Hemoglobin: 12.6 g/dL (ref 12.0–15.0)
Lymphocytes Relative: 33.9 % (ref 12.0–46.0)
MCHC: 33.7 g/dL (ref 30.0–36.0)
Monocytes Relative: 5.8 % (ref 3.0–12.0)
Neutrophils Relative %: 55.3 % (ref 43.0–77.0)
RBC: 4.06 Mil/uL (ref 3.87–5.11)
WBC: 3.6 10*3/uL — ABNORMAL LOW (ref 4.5–10.5)

## 2011-05-10 LAB — POCT URINALYSIS DIPSTICK
Blood, UA: NEGATIVE
Ketones, UA: NEGATIVE
Protein, UA: NEGATIVE
Spec Grav, UA: 1.02

## 2011-05-10 LAB — BASIC METABOLIC PANEL
Calcium: 9.2 mg/dL (ref 8.4–10.5)
Creatinine, Ser: 0.9 mg/dL (ref 0.4–1.2)
GFR: 68.31 mL/min (ref >60.00)
Sodium: 141 mEq/L (ref 135–145)

## 2011-05-10 LAB — HEPATIC FUNCTION PANEL
AST: 32 U/L (ref 0–37)
Alkaline Phosphatase: 70 U/L (ref 39–117)
Bilirubin, Direct: 0 mg/dL (ref 0.0–0.3)
Total Protein: 7.1 g/dL (ref 6.0–8.3)

## 2011-05-12 NOTE — Progress Notes (Signed)
Quick Note:  Pt aware ______ 

## 2011-05-17 ENCOUNTER — Telehealth: Payer: Self-pay | Admitting: *Deleted

## 2011-05-17 ENCOUNTER — Ambulatory Visit (INDEPENDENT_AMBULATORY_CARE_PROVIDER_SITE_OTHER): Payer: BC Managed Care – PPO | Admitting: Family Medicine

## 2011-05-17 ENCOUNTER — Encounter: Payer: Self-pay | Admitting: Family Medicine

## 2011-05-17 VITALS — BP 102/70 | HR 80 | Temp 98.4°F | Ht 61.75 in | Wt 130.0 lb

## 2011-05-17 DIAGNOSIS — Z0000 Encounter for general adult medical examination without abnormal findings: Secondary | ICD-10-CM

## 2011-05-17 NOTE — Progress Notes (Signed)
  Subjective:    Patient ID: Melinda Webster, female    DOB: 1960/12/09, 51 y.o.   MRN: [id number]  HPI 51 yr old female for a cpx. She feels great and has no concerns.    Review of Systems  Constitutional: Negative.   HENT: Negative.   Eyes: Negative.   Respiratory: Negative.   Cardiovascular: Negative.   Gastrointestinal: Negative.   Genitourinary: Negative for dysuria, urgency, frequency, hematuria, flank pain, decreased urine volume, enuresis, difficulty urinating, pelvic pain and dyspareunia.  Musculoskeletal: Negative.   Skin: Negative.   Neurological: Negative.   Hematological: Negative.   Psychiatric/Behavioral: Negative.        Objective:   Physical Exam  Constitutional: She is oriented to person, place, and time. She appears well-developed and well-nourished. No distress.  HENT:  Head: Normocephalic and atraumatic.  Right Ear: External ear normal.  Left Ear: External ear normal.  Nose: Nose normal.  Mouth/Throat: Oropharynx is clear and moist. No oropharyngeal exudate.  Eyes: Conjunctivae and EOM are normal. Pupils are equal, round, and reactive to light. No scleral icterus.  Neck: Normal range of motion. Neck supple. No JVD present. No thyromegaly present.  Cardiovascular: Normal rate, regular rhythm, normal heart sounds and intact distal pulses.  Exam reveals no gallop and no friction rub.   No murmur heard.      EKG normal  Pulmonary/Chest: Effort normal and breath sounds normal. No respiratory distress. She has no wheezes. She has no rales. She exhibits no tenderness.  Abdominal: Soft. Bowel sounds are normal. She exhibits no distension and no mass. There is no tenderness. There is no rebound and no guarding.  Musculoskeletal: Normal range of motion. She exhibits no edema and no tenderness.  Lymphadenopathy:    She has no cervical adenopathy.  Neurological: She is alert and oriented to person, place, and time. She has normal reflexes. No cranial nerve deficit. She  exhibits normal muscle tone. Coordination normal.  Skin: Skin is warm and dry. No rash noted. No erythema.  Psychiatric: She has a normal mood and affect. Her behavior is normal. Judgment and thought content normal.          Assessment & Plan:  Well exam

## 2011-05-17 NOTE — Telephone Encounter (Signed)
patient called in on 05-17-2011 and rescheduled for 07-2011

## 2011-05-19 ENCOUNTER — Other Ambulatory Visit: Payer: BC Managed Care – PPO | Admitting: Lab

## 2011-05-23 ENCOUNTER — Ambulatory Visit: Payer: BC Managed Care – PPO | Admitting: Oncology

## 2011-05-26 ENCOUNTER — Ambulatory Visit: Payer: BC Managed Care – PPO | Admitting: Oncology

## 2011-06-30 ENCOUNTER — Other Ambulatory Visit: Payer: Self-pay | Admitting: *Deleted

## 2011-06-30 DIAGNOSIS — C50919 Malignant neoplasm of unspecified site of unspecified female breast: Secondary | ICD-10-CM

## 2011-06-30 MED ORDER — TAMOXIFEN CITRATE 20 MG PO TABS: 20.0000 mg | ORAL_TABLET | Freq: Every day | ORAL | Status: DC

## 2011-07-27 IMAGING — CT CT CHEST W/ CM
1 of 4 series · 14 of 30 positions shown, 18 images · IV contrast (agent unspecified)
Comparison: Today's PET, dictated separately.  The prior breast MR
12/31/2008.

CT CHEST

CLINICAL DATA: Staging of right-sided breast cancer.  Diagnosed
12/26/2008.  No complaints.

CT CHEST, ABDOMEN AND PELVIS WITH CONTRAST
TECHNIQUE: Contiguous axial images of the chest abdomen and pelvis
were obtained after IV contrast administration.
Contrast: 100  ml Cmnipaque-OOO

[Series 2: cap with st · axial · 0.77mm/px · z∈[-599,-59]mm · 14 of 127 slices shown, 18 images]
[im 10/127  mediastinal]
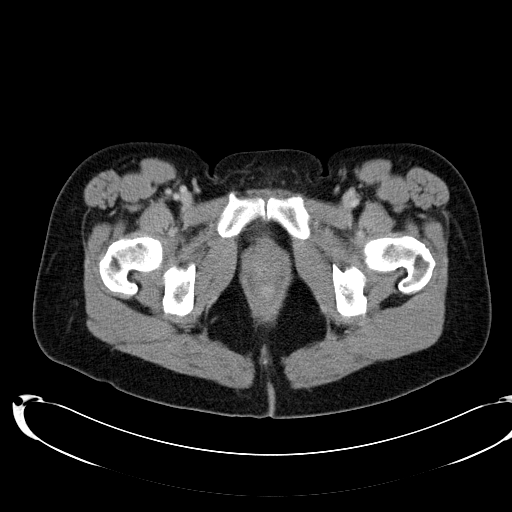
[im 10/127  lung]
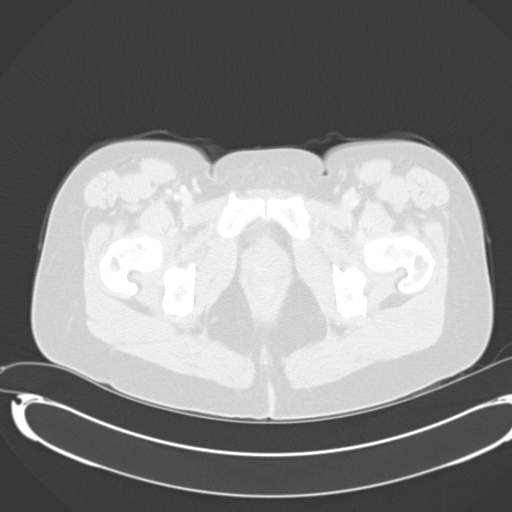
[im 19/127  lung]
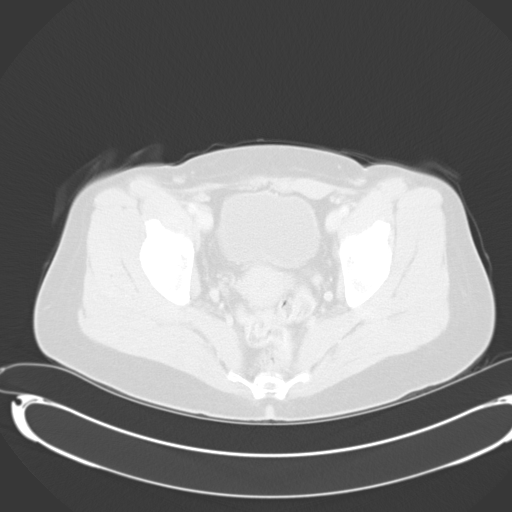
[im 28/127  lung]
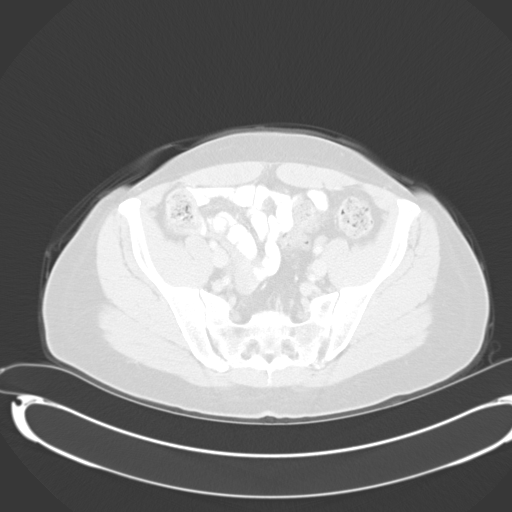
[im 37/127  lung]
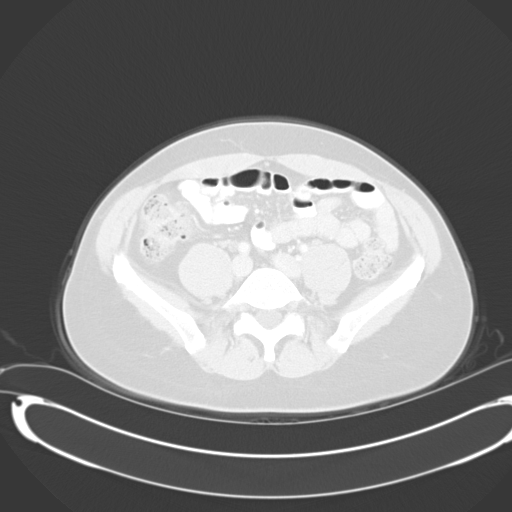
[im 46/127  mediastinal]
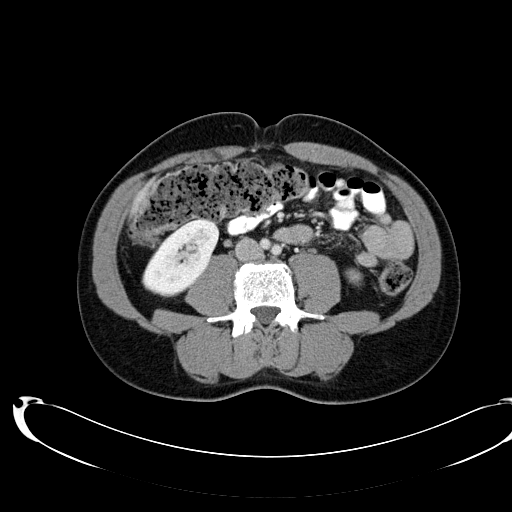
[im 46/127  lung]
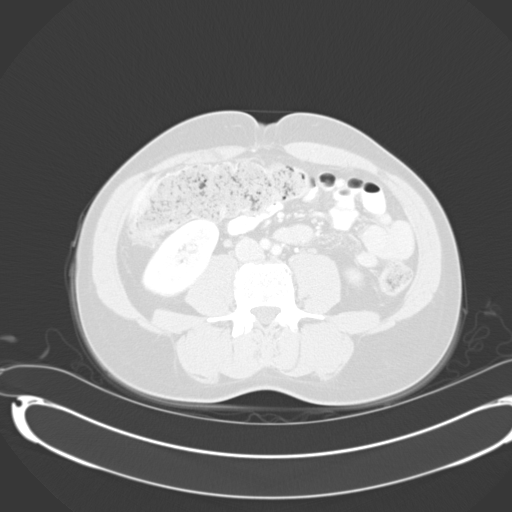
[im 55/127  lung]
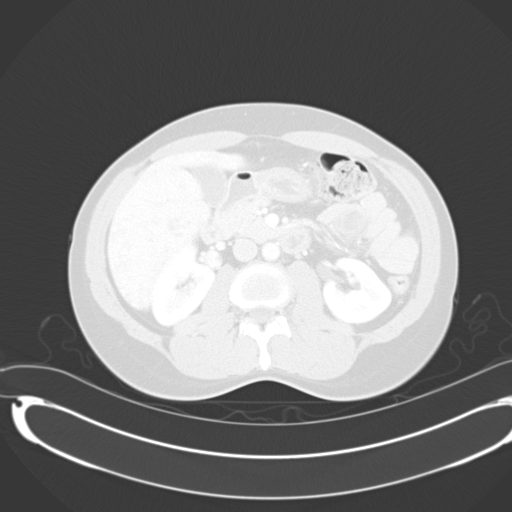
[im 62/127  lung]
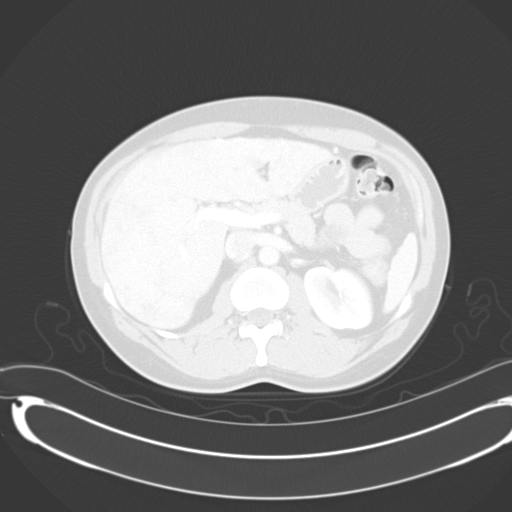
[im 64/127  lung]
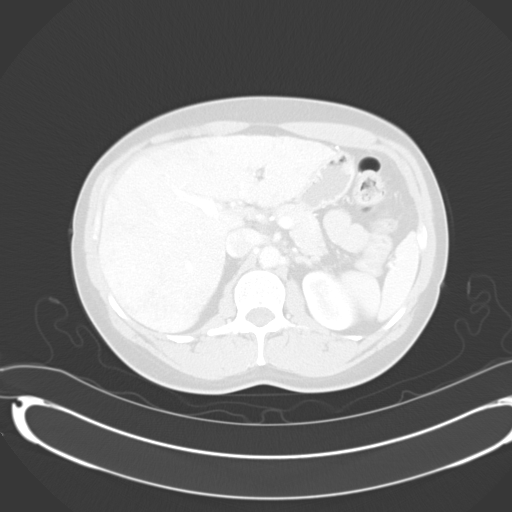
[im 73/127  mediastinal]
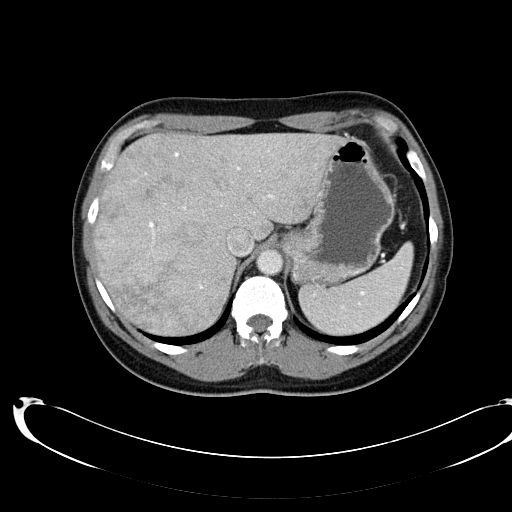
[im 73/127  lung]
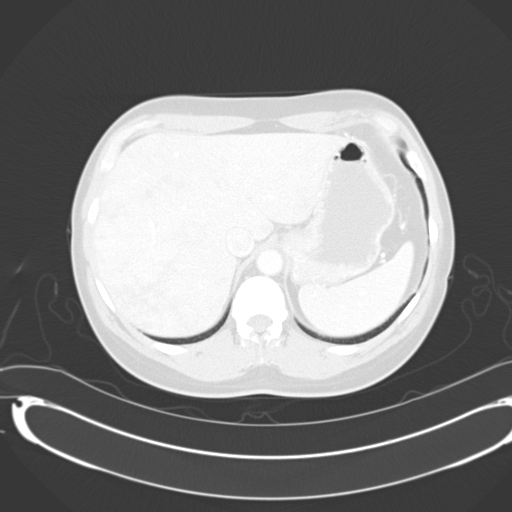
[im 82/127  lung]
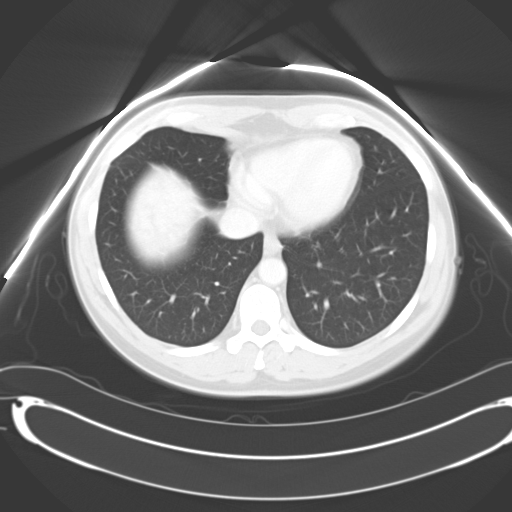
[im 91/127  lung]
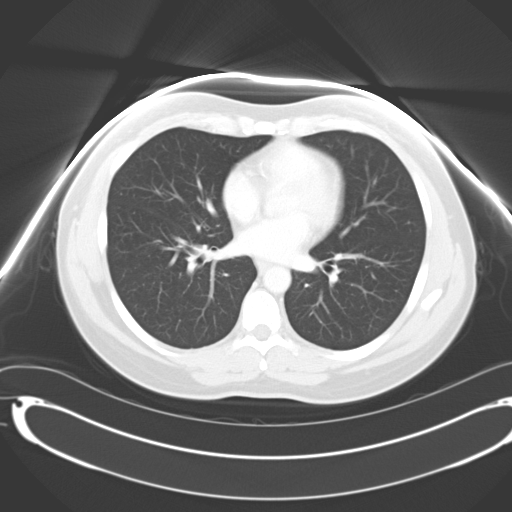
[im 100/127  lung]
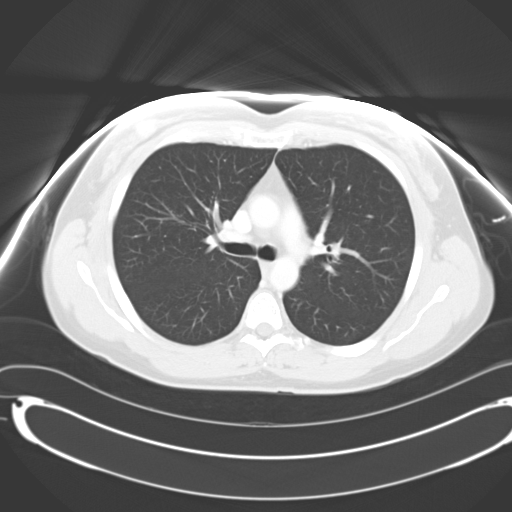
[im 109/127  mediastinal]
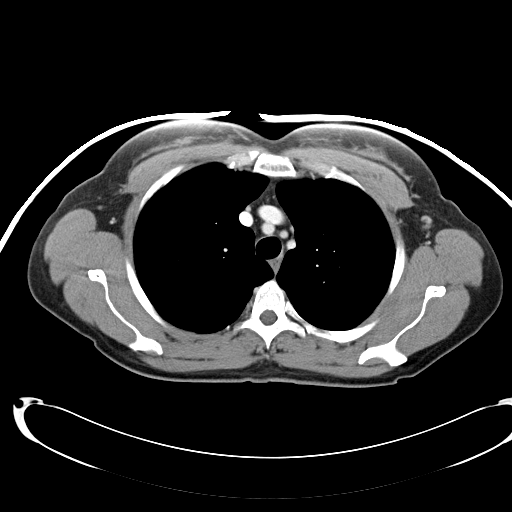
[im 109/127  lung]
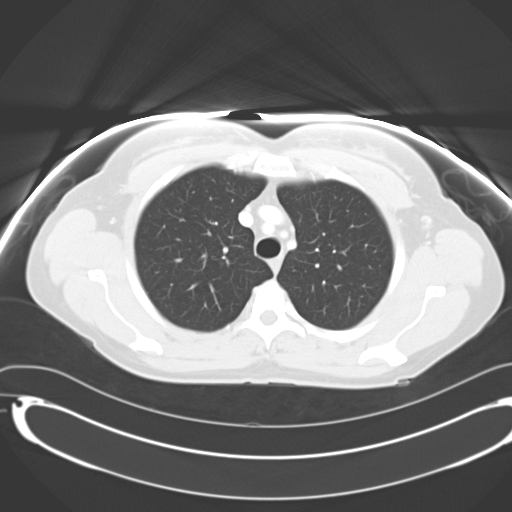
[im 118/127  lung]
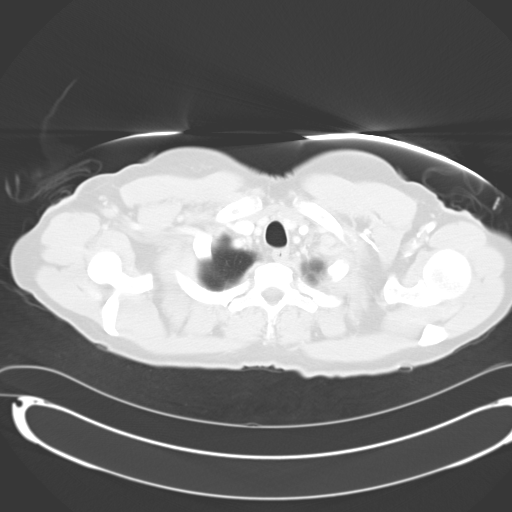

[14 of 30 positions shown; findings below may reference images not displayed]

FINDINGS: Lung windows demonstrate a subpleural nodular density
which measures 3 mm in the posterior right upper lobe on image 22.

Left lung clear.

Soft tissue windows demonstrate small bilateral axillary lymph
nodes.  Mild hyperenhancement, likely related to primary right
breast carcinoma image 28.

Normal heart size without pericardial or pleural effusion.  No
middle mediastinal or hilar adenopathy.  No internal mammary
adenopathy.
IMPRESSION: 1.  Right breast primary without evidence of metastatic disease in
the chest.
2.  Tiny right lung nodule is likely a subpleural lymph node.
Recommend attention on follow-up.

CT ABDOMEN
FINDINGS: Diffusely abnormal appearance of the liver on the
attempted portal venous phase images (which are actually in the
late arterial phase.) Heterogeneous enhancement throughout the
right lobe and inferior aspect left lobe liver.  No well-defined
lesion to strongly suggest metastatic disease.  An apparent area of
hypoenhancement which measures 1.6 cm in the right lobe on image 73
of series 2 is isoattenuating to the remainder of the liver on the
nephrographic images.  The imaged portions of the liver are
relatively normal appearance on the nephrographic phase images.

Normal spleen, stomach, pancreas, gallbladder, biliary tract,
adrenal glands, kidneys.

No retroperitoneal or retrocrural adenopathy. Colonic stool burden
suggests constipation.

Normal colon and terminal ileum.  Normal abdominal small bowel
without ascites.
IMPRESSION: 1.  No evidence of extra hepatic metastatic disease or typical
findings of hepatic metastasis.
2.  Abnormal appearance of the liver on this somewhat early phase
postcontrast imaging.  Considerations include an atypical
appearance of heterogeneous fatty infiltration, artifactual
heterogeneous enhancement due to the early phase imaging, a diffuse
hepatic process such as early cirrhosis, or a vascular phenomenon
such as Budd-Chiari syndrome.  Correlate with liver function tests,
risk factors for or known history of liver disease.  Consider
further evaluation with dedicated abdominal MRI.  If this is
performed, Eovist contrast should be considered.
3.  Probable constipation.

CT PELVIS
FINDINGS: Mild motion artifact involving the pelvis.  No pelvic
adenopathy.    Normal urinary bladder.  No adnexal mass.  Suspect a
right uterine fibroid with an area of hyperenhancement on image
104. No significant free fluid.  Probable bone islands x2 in the
left iliac bone.
IMPRESSION: 1. No acute process or evidence of metastatic disease in the
pelvis.
2.  Probable uterine fibroid.

## 2011-07-27 IMAGING — PT NM PET TUM IMG INITIAL (PI) SKULL BASE T - THIGH
1 of 6 series · 1 of 25 positions shown · non-contrast
Comparison: Today's diagnostic chest, abdomen and pelvic CTs,
dictated separately.  The prior breast MR 12/31/2008.

CLINICAL DATA: Initial treatment strategy for new diagnosis of
right-sided breast cancer.

NUCLEAR MEDICINE PET CT SKULL BASE TO THIGH
TECHNIQUE: 17.2 mCi F-18 FDG was injected intravenously via the
left AC.  Full-ring PET imaging was performed from the skull base
through the mid-thighs 78  minutes after injection.  CT data was
obtained and used for attenuation correction and anatomic
localization only.  (This was not acquired as a diagnostic CT
examination.)
Fasting Blood Glucose:  117

[Series 2: ct images · axial · 3.8mm · 0.98mm/px · 1 of 263 slices shown]
[im 263/263  brain]
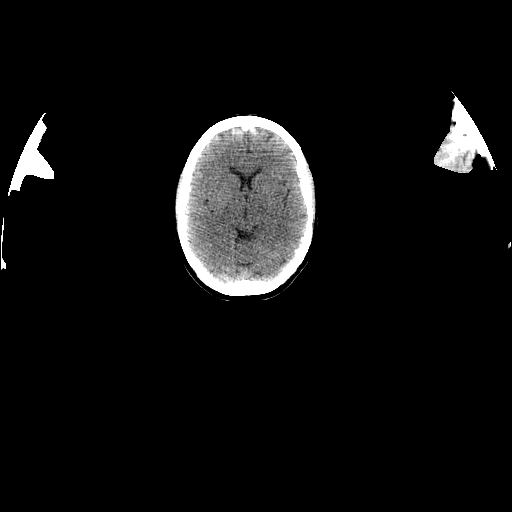

[1 of 25 positions shown; findings below may reference images not displayed]

FINDINGS: PET images demonstrate no abnormal activity within the
neck.  Likely corresponding to the patient's primary, within the
central right breast, adjacent a biopsy clip, is hypermetabolism.
This measures a S.U.V. max of 3.3 on approximately image 91.  No
hypermetabolic axillary lymph nodes.

No abnormal activity within the abdomen or pelvis.  No abnormal
activity within the liver to correspond to the CT abnormality.

CT images performed for attenuation correction demonstrate no
significant findings in the neck.  Mucosal thickening involving the
sphenoid sinuses and ethmoid air cells.  Mild bilateral maxillary
sinus mucosal thickening.

Chest, abdomen and pelvic findings deferred to diagnostic CTs,
dictated separately.
IMPRESSION: 1.  Right breast primary without evidence of hypermetabolic
metastatic disease.
2.  No hypermetabolism to correspond to the abnormal appearance of
the liver at CT.  Please see CT for further discussion.

## 2011-08-03 IMAGING — CR DG CHEST 1V PORT
1 series · 1 of 1 positions shown · non-contrast
Comparison: CT 01/06/2009.

CLINICAL DATA: Port-A-Cath placement.  Breast cancer.

PORTABLE CHEST - 1 VIEW

[view not recorded]
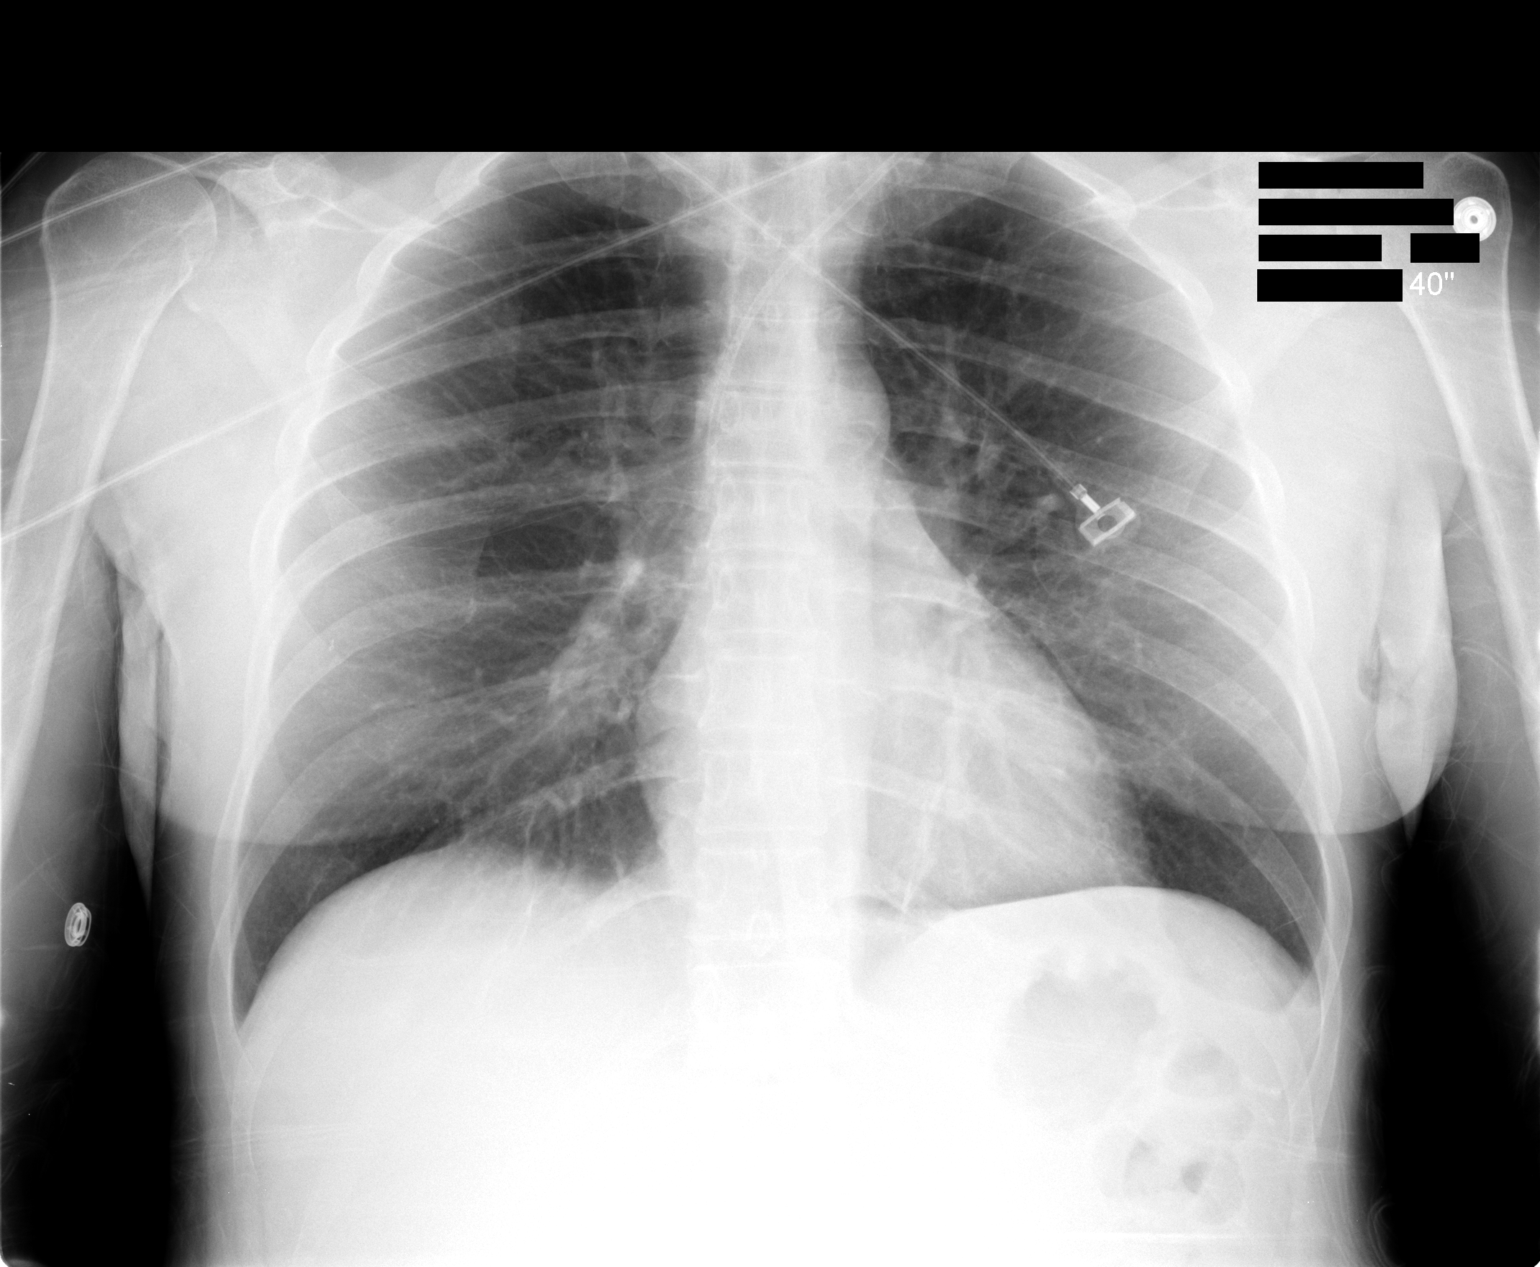

[1 of 1 positions shown; findings below may reference images not displayed]

FINDINGS: Left subclavian Port-A-Cath is in place with  the tip in
the SVC.  There is no pneumothorax.  There is no infiltrate or
effusion.  There is mild left lower lobe atelectasis.
IMPRESSION: Satisfactory Port-A-Cath placement.

## 2011-08-05 ENCOUNTER — Telehealth: Payer: Self-pay | Admitting: Medical Oncology

## 2011-08-05 DIAGNOSIS — C50919 Malignant neoplasm of unspecified site of unspecified female breast: Secondary | ICD-10-CM

## 2011-08-05 MED ORDER — ALENDRONATE SODIUM 35 MG PO TABS: 35.0000 mg | ORAL_TABLET | ORAL | Status: DC

## 2011-08-05 NOTE — Telephone Encounter (Signed)
Called to pt and pharmacy 

## 2011-08-16 ENCOUNTER — Ambulatory Visit (HOSPITAL_BASED_OUTPATIENT_CLINIC_OR_DEPARTMENT_OTHER): Payer: BC Managed Care – PPO | Admitting: Oncology

## 2011-08-16 ENCOUNTER — Other Ambulatory Visit (HOSPITAL_BASED_OUTPATIENT_CLINIC_OR_DEPARTMENT_OTHER): Payer: BC Managed Care – PPO | Admitting: Lab

## 2011-08-16 ENCOUNTER — Telehealth: Payer: Self-pay | Admitting: Oncology

## 2011-08-16 VITALS — BP 121/76 | HR 61 | Temp 97.7°F | Ht 61.75 in | Wt 129.7 lb

## 2011-08-16 DIAGNOSIS — C50519 Malignant neoplasm of lower-outer quadrant of unspecified female breast: Secondary | ICD-10-CM

## 2011-08-16 DIAGNOSIS — E559 Vitamin D deficiency, unspecified: Secondary | ICD-10-CM

## 2011-08-16 DIAGNOSIS — C50919 Malignant neoplasm of unspecified site of unspecified female breast: Secondary | ICD-10-CM

## 2011-08-16 DIAGNOSIS — Z170 Estrogen receptor positive status [ER+]: Secondary | ICD-10-CM

## 2011-08-16 LAB — CBC WITH DIFFERENTIAL/PLATELET
Basophils Absolute: 0.1 10*3/uL (ref 0.0–0.1)
EOS%: 4.1 % (ref 0.0–7.0)
HGB: 12.4 g/dL (ref 11.6–15.9)
LYMPH%: 32.8 % (ref 14.0–49.7)
MCH: 30.7 pg (ref 25.1–34.0)
MCV: 92.8 fL (ref 79.5–101.0)
MONO%: 6.6 % (ref 0.0–14.0)
Platelets: 186 10*3/uL (ref 145–400)
RDW: 12.9 % (ref 11.2–14.5)

## 2011-08-16 NOTE — Telephone Encounter (Signed)
gve the pt her jan 2014 appt along with the mammo/bone density appt

## 2011-08-16 NOTE — Progress Notes (Signed)
Hematology and Oncology Follow Up Visit  Melinda Webster [id number] 23-May-1960 51 y.o. 08/16/2011 9:05 AM   DIAGNOSIS: Breast cancer    PAST THERAPY: :  ER positive breast cancer status post neoadjuvant chemotherapy, completion of 4 cycles of dose-dense FEC and taxane-based therapy May 2011, status post surgery 06/03/2009 with residual 1 cm grade 1 tumor, ER/PR positive, status post radiation therapy completed 09/23/2009, on tamoxifen.    Interim History: Currently on tamoxifen, Fosamax and vitamin D Medications: I have reviewed the patient's current medications.  Allergies:  Allergies  Allergen Reactions  . Atorvastatin     REACTION: muscle aches    Past Medical History, Surgical history, Social history, and Family History were reviewed and updated.  Review of Systems: Constitutional:  Negative for fever, chills, night sweats, anorexia, weight loss, pain. Cardiovascular: no chest pain or dyspnea on exertion Respiratory: no cough, shortness of breath, or wheezing Neurological: negative Dermatological: negative ENT: negative Skin Gastrointestinal: negative Genito-Urinary: negative Hematological and Lymphatic: negative Breast: negative Musculoskeletal: negative Remaining ROS negative.  Physical Exam:  Blood pressure 121/76, pulse 61, temperature 97.7 F (36.5 C), height 5' 1.75" (1.568 m), weight 129 lb 11.2 oz (58.832 kg).  ECOG: 0  HEENT:  Sclerae anicteric, conjunctivae pink.  Oropharynx clear.  No mucositis or candidiasis.  Nodes:  No cervical, supraclavicular, or axillary lymphadenopathy palpated.  Breast Exam:  Right breast is status post implant and is benign.  No masses, discharge, skin change, or nipple inversion.  Left breast is benign.  No masses, discharge, skin change, or nipple inversion..  Lungs:  Clear to auscultation bilaterally.  No crackles, rhonchi, or wheezes.  Heart:  Regular rate and rhythm.  Abdomen:  Soft, nontender.  Positive bowel sounds.  No  organomegaly or masses palpated.  Musculoskeletal:  No focal spinal tenderness to palpation.  Extremities:  Benign.  No peripheral edema or cyanosis.  Skin:  Benign.  Neuro:  Nonfocal.     Lab Results: Lab Results  Component Value Date   WBC 3.9 08/16/2011   HGB 12.4 08/16/2011   HCT 37.5 08/16/2011   MCV 92.8 08/16/2011   PLT 186 08/16/2011     Chemistry      Component Value Date/Time   NA 141 05/10/2011 0852   K 4.0 05/10/2011 0852   CL 107 05/10/2011 0852   CO2 27 05/10/2011 0852   BUN 18 05/10/2011 0852   CREATININE 0.9 05/10/2011 0852      Component Value Date/Time   CALCIUM 9.2 05/10/2011 0852   ALKPHOS 70 05/10/2011 0852   AST 32 05/10/2011 0852   ALT 40* 05/10/2011 0852   BILITOT 0.5 05/10/2011 1324       Radiological Studies:  No results found.   IMPRESSIONS AND PLAN: A 51 y.o. female with   History of ER/PR positive breast cancers status post neoadjuvant chemotherapy with residual 1 cm tumor currently on tamoxifen status post radiation therapy and reconstruction Patient is doing well without evidence of recurrent disease. We discussed the current plan would be to have a mammogram and bone density test performed in November of this year. Following that we will discuss possible switch to Arimidex. She has been amenorrheic since initiation of chemotherapy which is close to 2 years ago. She feels otherwise well working full time and currently has a grandchild. She is taking her Fosamax without problems and vitamin D.  Spent more than half the time coordinating care, as well as discussion of BMI and its implications.  Melinda Webster 6/25/20139:05 AM Cell [id number]

## 2011-08-17 LAB — COMPREHENSIVE METABOLIC PANEL
AST: 21 U/L (ref 0–37)
Albumin: 4.6 g/dL (ref 3.5–5.2)
Alkaline Phosphatase: 60 U/L (ref 39–117)
BUN: 15 mg/dL (ref 6–23)
Creatinine, Ser: 0.91 mg/dL (ref 0.50–1.10)
Potassium: 4.5 mEq/L (ref 3.5–5.3)
Total Bilirubin: 0.5 mg/dL (ref 0.3–1.2)

## 2011-08-31 ENCOUNTER — Encounter (INDEPENDENT_AMBULATORY_CARE_PROVIDER_SITE_OTHER): Payer: Self-pay | Admitting: General Surgery

## 2011-11-30 ENCOUNTER — Other Ambulatory Visit: Payer: Self-pay | Admitting: *Deleted

## 2011-11-30 DIAGNOSIS — C50919 Malignant neoplasm of unspecified site of unspecified female breast: Secondary | ICD-10-CM

## 2011-11-30 MED ORDER — TAMOXIFEN CITRATE 20 MG PO TABS: 20.0000 mg | ORAL_TABLET | Freq: Every day | ORAL | Status: DC

## 2012-01-16 ENCOUNTER — Other Ambulatory Visit: Payer: BC Managed Care – PPO

## 2012-01-23 ENCOUNTER — Other Ambulatory Visit: Payer: BC Managed Care – PPO

## 2012-01-31 ENCOUNTER — Ambulatory Visit
Admission: RE | Admit: 2012-01-31 | Discharge: 2012-01-31 | Disposition: A | Discharge status: Home / Self Care | Payer: BC Managed Care – PPO | Source: Ambulatory Visit | Attending: Oncology | Admitting: Oncology

## 2012-01-31 DIAGNOSIS — E559 Vitamin D deficiency, unspecified: Secondary | ICD-10-CM

## 2012-01-31 DIAGNOSIS — C50919 Malignant neoplasm of unspecified site of unspecified female breast: Secondary | ICD-10-CM

## 2012-02-14 ENCOUNTER — Telehealth: Payer: Self-pay | Admitting: *Deleted

## 2012-02-14 NOTE — Telephone Encounter (Signed)
patient confirmed over the phone the cancelled appointment for 02-24-2012 

## 2012-02-20 ENCOUNTER — Telehealth: Payer: Self-pay | Admitting: *Deleted

## 2012-02-20 NOTE — Telephone Encounter (Signed)
Patient called very upset that 1. We called her on New Years eve and 2 that we don't have a plan to tell her who is going to be taking care of her life.  I told her that I can give her information to the Breast manager and have her to call to discuss the plan of care that we have for right now and she said no, she will just wait to see if someone decides to call her in the month of Jan.

## 2012-02-24 ENCOUNTER — Other Ambulatory Visit: Payer: BC Managed Care – PPO | Admitting: Lab

## 2012-02-24 ENCOUNTER — Ambulatory Visit: Payer: BC Managed Care – PPO | Admitting: Oncology

## 2012-03-02 ENCOUNTER — Telehealth: Payer: Self-pay | Admitting: *Deleted

## 2012-03-02 DIAGNOSIS — C50919 Malignant neoplasm of unspecified site of unspecified female breast: Secondary | ICD-10-CM

## 2012-03-02 NOTE — Telephone Encounter (Signed)
Spoke to pt and confirmed f/u appt with Bobbe Medico, NP on 03/06/12 at 8:45.  Pt to arrive at 8:15 for labs.  Pt denies further needs at this time.

## 2012-03-06 ENCOUNTER — Other Ambulatory Visit: Payer: BC Managed Care – PPO | Admitting: Lab

## 2012-03-06 ENCOUNTER — Ambulatory Visit: Payer: BC Managed Care – PPO | Admitting: Nurse Practitioner

## 2012-03-14 ENCOUNTER — Telehealth: Payer: Self-pay | Admitting: Oncology

## 2012-03-14 NOTE — Telephone Encounter (Signed)
Per Trey Paula reassigned pt to GM and r/s 1/14 appt to 1/28 w/SJ. lmonvm for pt w/a tentative date of 1/28 @ 9:45am and asked that pt call me back to confirm.

## 2012-03-15 ENCOUNTER — Telehealth: Payer: Self-pay | Admitting: Oncology

## 2012-03-15 NOTE — Telephone Encounter (Signed)
Pt returned call and was given appt for 1/28 @ 9:45am. Former pt of PR. Pt aware of reassignment w/GM and that she will see SJ.

## 2012-03-20 ENCOUNTER — Ambulatory Visit (HOSPITAL_BASED_OUTPATIENT_CLINIC_OR_DEPARTMENT_OTHER): Payer: BC Managed Care – PPO | Admitting: Nurse Practitioner

## 2012-03-20 ENCOUNTER — Other Ambulatory Visit (HOSPITAL_BASED_OUTPATIENT_CLINIC_OR_DEPARTMENT_OTHER): Payer: BC Managed Care – PPO | Admitting: Lab

## 2012-03-20 VITALS — BP 99/65 | HR 69 | Temp 98.1°F | Resp 20 | Ht 61.75 in | Wt 130.0 lb

## 2012-03-20 DIAGNOSIS — Z170 Estrogen receptor positive status [ER+]: Secondary | ICD-10-CM

## 2012-03-20 DIAGNOSIS — Z853 Personal history of malignant neoplasm of breast: Secondary | ICD-10-CM

## 2012-03-20 DIAGNOSIS — C50919 Malignant neoplasm of unspecified site of unspecified female breast: Secondary | ICD-10-CM

## 2012-03-20 DIAGNOSIS — M899 Disorder of bone, unspecified: Secondary | ICD-10-CM

## 2012-03-20 DIAGNOSIS — M8580 Other specified disorders of bone density and structure, unspecified site: Secondary | ICD-10-CM

## 2012-03-20 LAB — CBC WITH DIFFERENTIAL/PLATELET
BASO%: 1.1 % (ref 0.0–2.0)
EOS%: 3.3 % (ref 0.0–7.0)
MCH: 31 pg (ref 25.1–34.0)
MCHC: 34 g/dL (ref 31.5–36.0)
RBC: 3.97 10*6/uL (ref 3.70–5.45)
RDW: 12.4 % (ref 11.2–14.5)
WBC: 4.6 10*3/uL (ref 3.9–10.3)
lymph#: 1.3 10*3/uL (ref 0.9–3.3)

## 2012-03-20 LAB — COMPREHENSIVE METABOLIC PANEL (CC13)
BUN: 16.8 mg/dL (ref 7.0–26.0)
CO2: 26 mEq/L (ref 22–29)
Calcium: 9.3 mg/dL (ref 8.4–10.4)
Chloride: 106 mEq/L (ref 98–107)
Creatinine: 0.9 mg/dL (ref 0.6–1.1)

## 2012-03-21 ENCOUNTER — Telehealth: Payer: Self-pay | Admitting: Oncology

## 2012-03-21 ENCOUNTER — Encounter: Payer: Self-pay | Admitting: Nurse Practitioner

## 2012-03-21 NOTE — Progress Notes (Signed)
[facility]  Telephone:(336) [phone] Fax:([phone]) [phone]   OFFICE PROGRESS NOTE   Cc:  FRY,STEPHEN A, MD  DIAGNOSIS: invas PAST THERAPY: Neoadjuvant FEC followed by Taxane         Lumpectomy on 06/03/2009         Radiation completed 09/23/2009  CURRENT THERAPY: Tamoxifen  INTERVAL HISTORY: Melinda Webster 52 y.o. female returns for regularly scheduled follow up of her RIGHT breast cancer  - . She was treated with neoadjuvant FEC followed by a Taxane. she is status post lumpectomy on 06/03/2009, and completed radiation on 09/23/2009. She is tolerating the Tamoxifen without difficulty. 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   Past Medical History  Diagnosis Date  . Cancer 06/03/2009    Right lumpectomy+alnd,T1bN1a,ERPR+,Her2-  . Hyperlipidemia   . Gynecological examination     sees Dr. Malva Limes     Past Surgical History  Procedure Date  . Breast enhancement surgery 05/2010    reconstruction due to breast cancer  . Breast surgery 06/03/2009    Right lumpectomy & lymph node removal  . Colonoscopy 01-12-10    per Dr. Arlyce Dice, clear , repeat in 5 rys     Current Outpatient Prescriptions  Medication Sig Dispense Refill  . alendronate (FOSAMAX) 35 MG tablet Take 1 tablet (35 mg total) by mouth every 7 (seven) days. Take with a full glass of water on an empty stomach.  12 tablet  3  . aspirin 81 MG tablet Take 81 mg by mouth daily.      . Cholecalciferol (VITAMIN D PO) Take 2,000 Units by mouth daily.       . fish oil-omega-3 fatty acids 1000 MG capsule Take 2 g by mouth daily.       . Multiple Vitamin (MULTIVITAMIN) tablet Take 1 tablet by mouth daily.      . tamoxifen (NOLVADEX) 20 MG tablet Take 1 tablet (20 mg total) by mouth daily.  30 tablet  3    ALLERGIES:  is allergic to atorvastatin.  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  The rest of the 14-point  review of system was negative.   Filed Vitals:   03/20/12 1037  BP: 99/65  Pulse: 69  Temp: 98.1 F (36.7 C)  Resp: 20   Wt Readings from Last 3 Encounters:  03/20/12 130 lb (58.968 kg)  08/16/11 129 lb 11.2 oz (58.832 kg)  05/17/11 130 lb (58.968 kg)   ECOG Performance status: 0  PHYSICAL EXAMINATION:  General:  well-nourished in no acute distress.  Eyes:  no scleral icterus.  ENT:  There were no oropharyngeal lesions.  Neck was without thyromegaly.  Lymphatics:  Negative cervical, supraclavicular or axillary adenopathy. Breasts: Right breast with lumpectomy scar, no lumps or abnormal thickening or masses noted in either breast. Axillae negative. Respiratory: lungs were clear bilaterally without wheezing or crackles.   Cardiovascular:  Regular rate and rhythm, S1/S2, without murmur, rub or gallop.  There was no pedal edema.  GI:  abdomen was soft, flat, nontender, nondistended, without organomegaly.   Musculoskeletal:  no spinal tenderness of palpation of vertebral spine.  Skin exam was without echymosis, petichae.  Neuro exam was nonfocal.  Patient was able to get on and off exam table without assistance.  Gait was normal.  Patient was alerted and oriented.  Attention was good.   Language was appropriate.  Mood was normal without depression.  Speech was not pressured.  Thought content was not tangential.    LABORATORY/RADIOLOGY DATA:  Lab Results  Component Value Date   WBC 4.6 03/20/2012   HGB 12.3 03/20/2012   HCT 36.1 03/20/2012   PLT 241 03/20/2012   GLUCOSE 107* 03/20/2012   CHOL 193 05/10/2011   TRIG 129.0 05/10/2011   HDL 59.40 05/10/2011   LDLDIRECT 134.4 10/08/2009   LDLCALC 108* 05/10/2011   ALKPHOS 67 03/20/2012   ALT 20 03/20/2012   AST 16 03/20/2012   NA 140 03/20/2012   K 4.1 03/20/2012   CL 106 03/20/2012   CREATININE 0.9 03/20/2012   BUN 16.8 03/20/2012   CO2 26 03/20/2012         ASSESSMENT AND PLAN: Invasive ductal carcinoma, ER+, PR+, Her2 negative, s/p  neoadjuvant chemo, lumpectomy & radiation. Currently on Tamoxifen  Patient will follow up with Dr. Darnelle Catalan in 6 months. She is doing well overall.  She was introduced to Dr. Darnelle Catalan, who talked briefly with her about the possibility of switching to an AI at the next office visit. We reviewed the results of her recent bone scan which showed a T score of -1.3, consistent with mild osteopenia. The patient does take Vitamin D.  The length of time of the face-to-face encounter was 30    minutes. More than 50% of time was spent counseling and coordination of care.  Bobbe Medico, NP-C, AOCNP

## 2012-03-21 NOTE — Telephone Encounter (Signed)
S/w the pt and she is aware of her f/u appts in aug

## 2012-04-09 ENCOUNTER — Ambulatory Visit (INDEPENDENT_AMBULATORY_CARE_PROVIDER_SITE_OTHER): Payer: BC Managed Care – PPO | Admitting: Family Medicine

## 2012-04-09 ENCOUNTER — Encounter: Payer: Self-pay | Admitting: Family Medicine

## 2012-04-09 VITALS — BP 102/60 | HR 83 | Temp 98.2°F | Wt 132.0 lb

## 2012-04-09 DIAGNOSIS — J019 Acute sinusitis, unspecified: Secondary | ICD-10-CM

## 2012-04-09 MED ORDER — AMOXICILLIN 875 MG PO TABS: 875.0000 mg | ORAL_TABLET | Freq: Two times a day (BID) | ORAL | Status: DC

## 2012-04-09 NOTE — Progress Notes (Signed)
  Subjective:    Patient ID: Melinda Webster, female    DOB: [birth year], 52 y.o.   MRN: [id number]  HPI Here for 3 weeks of sinus pressure, HA, PND, and coughing up green sputum. No fever.    Review of Systems  Constitutional: Negative.   HENT: Positive for congestion, postnasal drip and sinus pressure.   Eyes: Negative.   Respiratory: Positive for cough.        Objective:   Physical Exam  Constitutional: She appears well-developed and well-nourished. No distress.  HENT:  Right Ear: External ear normal.  Left Ear: External ear normal.  Nose: Nose normal.  Mouth/Throat: Oropharynx is clear and moist.  Eyes: Conjunctivae are normal.  Pulmonary/Chest: Effort normal and breath sounds normal.  Lymphadenopathy:    She has no cervical adenopathy.          Assessment & Plan:  Add Mucinex D.

## 2012-04-18 ENCOUNTER — Telehealth: Payer: Self-pay | Admitting: Family Medicine

## 2012-04-18 MED ORDER — LEVOFLOXACIN 500 MG PO TABS: 500.0000 mg | ORAL_TABLET | Freq: Every day | ORAL | Status: DC

## 2012-04-18 NOTE — Telephone Encounter (Signed)
Pt was here last Monday and dx w/sinus infection. Rx'd amoxicillin (AMOXIL) 875 MG tablet. Pt was told to call Dr. Clent Ridges if it did not work and he could rx her something else. Sinus infection not gone. Pt uses [facility] on [address]. Thank you.

## 2012-04-18 NOTE — Telephone Encounter (Signed)
I called in script and spoke with pt. 

## 2012-04-18 NOTE — Telephone Encounter (Signed)
Call in Levaquin 500 mg q day for 10 days

## 2012-04-18 NOTE — Telephone Encounter (Signed)
Caller: Jori/Patient; Phone: [phone]; Reason for Call: Patient calling to check on new antibiotic that she had requested about 10: 30 am today.   Note in EPIC has new order - should be called in by office.  Patient notified.

## 2012-04-30 ENCOUNTER — Other Ambulatory Visit: Payer: Self-pay | Admitting: *Deleted

## 2012-04-30 DIAGNOSIS — C50919 Malignant neoplasm of unspecified site of unspecified female breast: Secondary | ICD-10-CM

## 2012-04-30 MED ORDER — TAMOXIFEN CITRATE 20 MG PO TABS: 20.0000 mg | ORAL_TABLET | Freq: Every day | ORAL | Status: DC

## 2012-07-10 ENCOUNTER — Other Ambulatory Visit: Payer: Self-pay | Admitting: Obstetrics and Gynecology

## 2012-10-18 ENCOUNTER — Ambulatory Visit: Payer: BC Managed Care – PPO | Admitting: Oncology

## 2012-10-18 ENCOUNTER — Other Ambulatory Visit: Payer: BC Managed Care – PPO | Admitting: Lab

## 2012-10-19 ENCOUNTER — Telehealth: Payer: Self-pay | Admitting: Oncology

## 2012-10-19 NOTE — Telephone Encounter (Signed)
Sent letter to patient from Dr. Magrinat. °

## 2012-12-27 ENCOUNTER — Telehealth: Payer: Self-pay | Admitting: *Deleted

## 2012-12-27 NOTE — Telephone Encounter (Signed)
Return the pt call. She was upset because no had made an appt per her request. However she did not remember that names of the people she had left messages with. I transfer her to Dawn's vm, and also sent staff messages...td

## 2012-12-27 NOTE — Telephone Encounter (Signed)
Pt called very upset that no one has called her back to schedule a f/u appt to see Dr. Darnelle Catalan.  Performed service recovery.   Scheduled pt to be seen on 01/08/13 at 1:30 for labs, 2:00 for Dr. Darnelle Catalan.   Confirmed appt date and time.  Pt appreciated call and scheduling her appt.

## 2013-01-08 ENCOUNTER — Other Ambulatory Visit (HOSPITAL_BASED_OUTPATIENT_CLINIC_OR_DEPARTMENT_OTHER): Payer: BC Managed Care – PPO

## 2013-01-08 ENCOUNTER — Ambulatory Visit (HOSPITAL_BASED_OUTPATIENT_CLINIC_OR_DEPARTMENT_OTHER): Payer: BC Managed Care – PPO | Admitting: Oncology

## 2013-01-08 ENCOUNTER — Telehealth: Payer: Self-pay | Admitting: *Deleted

## 2013-01-08 VITALS — BP 109/71 | HR 73 | Temp 97.9°F | Resp 18 | Ht 61.75 in | Wt 136.4 lb

## 2013-01-08 DIAGNOSIS — M8580 Other specified disorders of bone density and structure, unspecified site: Secondary | ICD-10-CM

## 2013-01-08 DIAGNOSIS — C50519 Malignant neoplasm of lower-outer quadrant of unspecified female breast: Secondary | ICD-10-CM

## 2013-01-08 DIAGNOSIS — C50919 Malignant neoplasm of unspecified site of unspecified female breast: Secondary | ICD-10-CM

## 2013-01-08 DIAGNOSIS — Z170 Estrogen receptor positive status [ER+]: Secondary | ICD-10-CM

## 2013-01-08 DIAGNOSIS — C50511 Malignant neoplasm of lower-outer quadrant of right female breast: Secondary | ICD-10-CM

## 2013-01-08 LAB — CBC WITH DIFFERENTIAL/PLATELET
Basophils Absolute: 0.1 10*3/uL (ref 0.0–0.1)
HCT: 37.1 % (ref 34.8–46.6)
HGB: 12.2 g/dL (ref 11.6–15.9)
MONO#: 0.3 10*3/uL (ref 0.1–0.9)
NEUT%: 57.7 % (ref 38.4–76.8)
WBC: 4.7 10*3/uL (ref 3.9–10.3)
lymph#: 1.5 10*3/uL (ref 0.9–3.3)

## 2013-01-08 LAB — COMPREHENSIVE METABOLIC PANEL (CC13)
ALT: 19 U/L (ref 0–55)
AST: 23 U/L (ref 5–34)
Albumin: 4.2 g/dL (ref 3.5–5.0)
Anion Gap: 8 mEq/L (ref 3–11)
BUN: 14.8 mg/dL (ref 7.0–26.0)
Calcium: 9.5 mg/dL (ref 8.4–10.4)
Chloride: 109 mEq/L (ref 98–109)
Potassium: 4.2 mEq/L (ref 3.5–5.1)

## 2013-01-08 MED ORDER — ESTRADIOL 10 MCG VA TABS: 1.0000 | ORAL_TABLET | VAGINAL | Status: DC

## 2013-01-08 NOTE — Progress Notes (Deleted)
[facility]  Telephone:(336) [phone] Fax:([phone]) [phone]   OFFICE PROGRESS NOTE   Cc:  FRY,STEPHEN A, MD  DIAGNOSIS: invas PAST THERAPY: Neoadjuvant FEC followed by Taxane         Lumpectomy on 06/03/2009         Radiation completed 09/23/2009  CURRENT THERAPY: Tamoxifen  INTERVAL HISTORY: Melinda Webster 52 y.o. female returns for regularly scheduled follow up of her RIGHT breast cancer  - . She was treated with neoadjuvant FEC followed by a Taxane. she is status post lumpectomy on 06/03/2009, and completed radiation on 09/23/2009. She is tolerating the Tamoxifen without difficulty. 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   Past Medical History  Diagnosis Date  . Cancer 06/03/2009    Right lumpectomy+alnd,T1bN1a,ERPR+,Her2-  . Hyperlipidemia   . Gynecological examination     sees Dr. Malva Limes     Past Surgical History  Procedure Laterality Date  . Breast enhancement surgery  05/2010    reconstruction due to breast cancer  . Breast surgery  06/03/2009    Right lumpectomy & lymph node removal  . Colonoscopy  01-12-10    per Dr. Arlyce Dice, clear , repeat in 5 rys     Current Outpatient Prescriptions  Medication Sig Dispense Refill  . amoxicillin (AMOXIL) 875 MG tablet Take 1 tablet (875 mg total) by mouth 2 (two) times daily.  20 tablet  0  . aspirin 81 MG tablet Take 81 mg by mouth daily.      . Cholecalciferol (VITAMIN D PO) Take 2,000 Units by mouth daily.       . fish oil-omega-3 fatty acids 1000 MG capsule Take 2 g by mouth daily.       Marland Kitchen levofloxacin (LEVAQUIN) 500 MG tablet Take 1 tablet (500 mg total) by mouth daily.  10 tablet  0  . Multiple Vitamin (MULTIVITAMIN) tablet Take 1 tablet by mouth daily.      . tamoxifen (NOLVADEX) 20 MG tablet Take 1 tablet (20 mg total) by mouth daily.  30 tablet  5   No current facility-administered medications for this visit.    ALLERGIES:  is  allergic to atorvastatin.  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  The rest of the 14-point review of system was negative.   Filed Vitals:   01/08/13 1407  BP: 109/71  Pulse: 73  Temp: 97.9 F (36.6 C)  Resp: 18   Wt Readings from Last 3 Encounters:  01/08/13 136 lb 6.4 oz (61.871 kg)  04/09/12 132 lb (59.875 kg)  03/20/12 130 lb (58.968 kg)   ECOG Performance status: 0  PHYSICAL EXAMINATION:  General:  well-nourished in no acute distress.  Eyes:  no scleral icterus.  ENT:  There were no oropharyngeal lesions.  Neck was without thyromegaly.  Lymphatics:  Negative cervical, supraclavicular or axillary adenopathy. Breasts: Right breast with lumpectomy scar, no lumps or abnormal thickening or masses noted in either breast. Axillae negative. Respiratory: lungs were clear bilaterally without wheezing or crackles.   Cardiovascular:  Regular rate and rhythm, S1/S2, without murmur, rub or gallop.  There was no pedal edema.  GI:  abdomen was soft, flat, nontender, nondistended, without organomegaly.   Musculoskeletal:  no spinal tenderness of palpation of vertebral spine.  Skin exam was without echymosis, petichae.  Neuro exam was nonfocal.  Patient was able to get on and off exam  table without assistance.  Gait was normal.  Patient was alerted and oriented.  Attention was good.   Language was appropriate.  Mood was normal without depression.  Speech was not pressured.  Thought content was not tangential.    LABORATORY/RADIOLOGY DATA:  Lab Results  Component Value Date   WBC 4.7 01/08/2013   HGB 12.2 01/08/2013   HCT 37.1 01/08/2013   PLT 209 01/08/2013   GLUCOSE 107* 03/20/2012   CHOL 193 05/10/2011   TRIG 129.0 05/10/2011   HDL 59.40 05/10/2011   LDLDIRECT 134.4 10/08/2009   LDLCALC 108* 05/10/2011   ALKPHOS 67 03/20/2012   ALT 20 03/20/2012   AST 16 03/20/2012   NA 140 03/20/2012   K 4.1 03/20/2012   CL 106 03/20/2012   CREATININE 0.9 03/20/2012   BUN  16.8 03/20/2012   CO2 26 03/20/2012         ASSESSMENT AND PLAN: Invasive ductal carcinoma, ER+, PR+, Her2 negative, s/p neoadjuvant chemo, lumpectomy & radiation. Currently on Tamoxifen  Patient will follow up with Dr. Darnelle Catalan in 6 months. She is doing well overall.  She was introduced to Dr. Darnelle Catalan, who talked briefly with her about the possibility of switching to an AI at the next office visit. We reviewed the results of her recent bone scan which showed a T score of -1.3, consistent with mild osteopenia. The patient does take Vitamin D.  The length of time of the face-to-face encounter was 30    minutes. More than 50% of time was spent counseling and coordination of care.  Bobbe Medico, NP-C, AOCNP

## 2013-01-08 NOTE — Telephone Encounter (Signed)
appts made and printed. Pt is aware that i gv her [facility] referral to Tiffany to make her appt...td

## 2013-01-09 ENCOUNTER — Telehealth: Payer: Self-pay | Admitting: Oncology

## 2013-01-09 DIAGNOSIS — C50511 Malignant neoplasm of lower-outer quadrant of right female breast: Secondary | ICD-10-CM | POA: Insufficient documentation

## 2013-01-09 NOTE — Progress Notes (Signed)
ID: Melinda Webster OB: 07/26/1960  MR#: [id number]  AOZ#:[id number]  PCP: Nelwyn Salisbury, MD GYN:   SUFelicity [place]  OTHER MD: [place]  CHIEF COMPLAINT: "I am doing fine".  HISTORY OF PRESENT ILLNESS: From Dr. Doreatha Massed intake note 12/31/2008:  "This woman has been in good health.  She had a screening mammogram on 12/18/2008.  This revealed an area of asymmetry in the right breast in the retroareolar location middle third.  Additional views were recommended.  This was performed on 12/25/2008 with ultrasound.  Essentially this showed an ill-defined hypodense spiculated mass 2.3 cm with some calcifications extending posteriorly.  Ultrasound confirmed the presence of this mass at the 6 to 7 o'clock position, 1.9 cm parallel to the chest wall, no evidence of abnormal lymph node.  Biopsy was performed 12/25/2008.  This showed invasive mammary carcinoma.  Prognostic panel is pending as is MRI scan.  The patient was seen by Dr. Carolynne Edouard who in turn referred her for possible neoadjuvant therapy given the size of the mass and its proximity to the nipple-areolar complex and the general breast size."  The patient's subsequent history is as detailed below  INTERVAL HISTORY: [place] returns today for followup of her breast cancer. She is establishing herself in my practice today. She tells me she stopped her tamoxifen about 3 weeks ago because she wanted to see what happened and she "had more estrogen". She has been reading about plant estrogens and by your identical hormones. " I would prefer not to keep putting something foreign into my body".  REVIEW OF SYSTEMS: She has not noted any change in symptoms one way or the other since stopping her tamoxifen. She has felt, for the past year, a worminess especially over her legs. They "move on their own". She is describing fasciculations, . These are not painful but do bother her, especially at night. She has some joint pain, and was evaluated by Limmie Patricia for what  appears to have been a right elbow tendinitis. She has significant vaginal atrophy symptoms. A separate problem is the fact that she is really not satisfied with her breast reconstruction. She would like to consider "some revisions". A detailed review of systems was otherwise noncontributory    PAST MEDICAL HISTORY: Past Medical History  Diagnosis Date  . Cancer 06/03/2009    Right lumpectomy+alnd,T1bN1a,ERPR+,Her2-  . Hyperlipidemia   . Gynecological examination     sees Dr. Malva Limes     PAST SURGICAL HISTORY: Past Surgical History  Procedure Laterality Date  . Breast enhancement surgery  05/2010    reconstruction due to breast cancer  . Breast surgery  06/03/2009    Right lumpectomy & lymph node removal  . Colonoscopy  01-12-10    per Dr. Arlyce Dice, clear , repeat in 5 rys     FAMILY HISTORY Family History  Problem Relation Age of Onset  . Cancer Father     colon  . Colitis Brother    the patient's father died at the age of 27 with colon cancer, which was diagnosed when he was 53 years old. The patient's mother is living, currently 66 years old. The patient had 2 brothers, one sister. There is no history of breast or ovarian cancer in the family. The patient was tested November 2010 for the BRCA genes and was found to not carrying a mutation.  GYNECOLOGIC HISTORY:  Menarche age 47, she is GX P2. First live birth age 50. She stopped having periods with the start  of chemotherapy December of 2010. She did not use hormone replacement.  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 Her husband, [place], also works in the business. The patient's son works with [place] in [place]. The patient's daughter lives in [place] where she runs an office. The patient has one grandson.     ADVANCED DIRECTIVES:    HEALTH MAINTENANCE: History  Substance Use Topics  . Smoking status: Never Smoker   . Smokeless tobacco: Never Used  . Alcohol Use: 1.8 oz/week    3  Glasses of wine per week     Colonoscopy:  PAP:  Bone density: 01/31/2012, showed osteopenia  Lipid panel:  Allergies  Allergen Reactions  . Atorvastatin     REACTION: muscle aches    Current Outpatient Prescriptions  Medication Sig Dispense Refill  . amoxicillin (AMOXIL) 875 MG tablet Take 1 tablet (875 mg total) by mouth 2 (two) times daily.  20 tablet  0  . aspirin 81 MG tablet Take 81 mg by mouth daily.      . Cholecalciferol (VITAMIN D PO) Take 2,000 Units by mouth daily.       . Estradiol (VAGIFEM) 10 MCG TABS vaginal tablet Place 1 tablet (10 mcg total) vaginally every Monday, Wednesday, and Friday.  16 tablet  12  . fish oil-omega-3 fatty acids 1000 MG capsule Take 2 g by mouth daily.       Marland Kitchen levofloxacin (LEVAQUIN) 500 MG tablet Take 1 tablet (500 mg total) by mouth daily.  10 tablet  0  . Multiple Vitamin (MULTIVITAMIN) tablet Take 1 tablet by mouth daily.      . tamoxifen (NOLVADEX) 20 MG tablet Take 1 tablet (20 mg total) by mouth daily.  30 tablet  5   No current facility-administered medications for this visit.    OBJECTIVE: Middle-aged white woman who appears well  Filed Vitals:   01/08/13 1407  BP: 109/71  Pulse: 73  Temp: 97.9 F (36.6 C)  Resp: 18     Body mass index is 25.16 kg/(m^2).    ECOG FS:0 - Asymptomatic  Ocular: Sclerae unicteric, pupils equal, round and reactive to light Ear-nose-throat: Oropharynx clear, dentition fair Lymphatic: No cervical or supraclavicular adenopathy Lungs no rales or rhonchi, good excursion bilaterally Heart regular rate and rhythm, no murmur appreciated Abd soft, nontender, positive bowel sounds MSK no focal spinal tenderness, no joint edema Neuro: non-focal, well-oriented, appropriate affect Breasts: The right breast is status post lumpectomy and radiation. There is no evidence of local recurrence. The right axilla is benign. The left breast is unremarkable. The patient has undergone implant reconstruction   LAB  RESULTS:  CMP     Component Value Date/Time   NA 141 01/08/2013 1349   NA 139 08/16/2011 0841   K 4.2 01/08/2013 1349   K 4.5 08/16/2011 0841   CL 106 03/20/2012 0947   CL 107 08/16/2011 0841   CO2 23 01/08/2013 1349   CO2 26 08/16/2011 0841   GLUCOSE 87 01/08/2013 1349   GLUCOSE 107* 03/20/2012 0947   GLUCOSE 97 08/16/2011 0841   BUN 14.8 01/08/2013 1349   BUN 15 08/16/2011 0841   CREATININE 0.8 01/08/2013 1349   CREATININE 0.91 08/16/2011 0841   CALCIUM 9.5 01/08/2013 1349   CALCIUM 9.1 08/16/2011 0841   PROT 7.2 01/08/2013 1349   PROT 6.6 08/16/2011 0841   ALBUMIN 4.2 01/08/2013 1349   ALBUMIN 4.6 08/16/2011 0841   AST 23 01/08/2013 1349  AST 21 08/16/2011 0841   ALT 19 01/08/2013 1349   ALT 25 08/16/2011 0841   ALKPHOS 67 01/08/2013 1349   ALKPHOS 60 08/16/2011 0841   BILITOT 0.28 01/08/2013 1349   BILITOT 0.5 08/16/2011 0841   GFRNONAA 85.70 10/08/2009 1033   GFRAA  Value: >60        The eGFR has been calculated using the MDRD equation. This calculation has not been validated in all clinical situations. eGFR's persistently <60 mL/min signify possible Chronic Kidney Disease. 06/02/2009 1500    I No results found for this basename: SPEP, UPEP,  kappa and lambda light chains    Lab Results  Component Value Date   WBC 4.7 01/08/2013   NEUTROABS 2.7 01/08/2013   HGB 12.2 01/08/2013   HCT 37.1 01/08/2013   MCV 92.3 01/08/2013   PLT 209 01/08/2013      Chemistry      Component Value Date/Time   NA 141 01/08/2013 1349   NA 139 08/16/2011 0841   K 4.2 01/08/2013 1349   K 4.5 08/16/2011 0841   CL 106 03/20/2012 0947   CL 107 08/16/2011 0841   CO2 23 01/08/2013 1349   CO2 26 08/16/2011 0841   BUN 14.8 01/08/2013 1349   BUN 15 08/16/2011 0841   CREATININE 0.8 01/08/2013 1349   CREATININE 0.91 08/16/2011 0841      Component Value Date/Time   CALCIUM 9.5 01/08/2013 1349   CALCIUM 9.1 08/16/2011 0841   ALKPHOS 67 01/08/2013 1349   ALKPHOS 60 08/16/2011 0841   AST 23 01/08/2013  1349   AST 21 08/16/2011 0841   ALT 19 01/08/2013 1349   ALT 25 08/16/2011 0841   BILITOT 0.28 01/08/2013 1349   BILITOT 0.5 08/16/2011 0841       Lab Results  Component Value Date   LABCA2 25 12/23/2010    No components found with this basename: [id number]    No results found for this basename: INR,  in the last 168 hours  Urinalysis    Component Value Date/Time   COLORURINE yellow 10/08/2009 0938   APPEARANCEUR Clear 10/08/2009 0938   LABSPEC 1.020 10/08/2009 0938   PHURINE 7.0 10/08/2009 0938   GLUCOSEU NEGATIVE 06/02/2009 1500   HGBUR negative 10/08/2009 0938   HGBUR NEGATIVE 06/02/2009 1500   BILIRUBINUR n 05/10/2011 0910   BILIRUBINUR negative 10/08/2009 0938   KETONESUR NEGATIVE 06/02/2009 1500   PROTEINUR NEGATIVE 06/02/2009 1500   UROBILINOGEN 0.2 05/10/2011 0910   UROBILINOGEN 0.2 10/08/2009 0938   NITRITE n 05/10/2011 0910   NITRITE negative 10/08/2009 0938   LEUKOCYTESUR Negative 05/10/2011 0910    STUDIES: next mammogram is due December 2013  ASSESSMENT: 52 y.o. BRCA negative [place] woman status post biopsy of her right breast lower outer quadrant mass 12/25/2008 for an invasive ductal carcinoma, clinically T2 N0 or stage IIA, estrogen receptor and progesterone receptor both positive at approximately 50%, with an MIB-1 of 20%  (1) treated neoadjuvantly wit fluorouracil, epirubicin and cyclophosphamide for 6 cycles between December 2010 and January 2011 followed by 2 cycles of docetaxel completed February of 2011, followed by 3 doses of Abraxane completed March of 2011  (2) status post right lumpectomy and sentinel lymph node sampling April 2011 for a pT1c pN0, stage IA invasive ductal carcinoma, grade 1, estrogen and progesterone receptor positive, with an MIB-1 of 18%, and no HER-2 amplification  (3) adjuvant radiation completed 09/23/2009  (4) on tamoxifen since August of 2011  PLAN: I spent well over  an hour with Nikyah today discussing her situation. First we  went over the fact that tamoxifen does not "take away her estrogen", the way the aromatase inhibitors do. She actually may be making a little bit extra estrogen because of the tamoxifen receptor block. Furthermore, while on tamoxifen, she can use Estring or Vagifem suppositories as needed for vaginal dryness issues. This is very attractive to her.  She understands that she would not be able to do this if we switch to an aromatase inhibitor. I would be uncomfortable using any kind of estrogen preparation, including local estrogens, without the estrogen blockade in place.  After this discussion she decided she would go back to tamoxifen and would like to try the Vagifem suppositories. We discussed how to do use this medication and she will specifically start with insertion 3 times a week, decreasing as tolerated once the desired level of flexibility and comfort is achieved.  We also reviewed her prognosis. She would've had a risk of recurrence of 26% with local treatment only, but if she manages to complete 5 years of tamoxifen, together with the chemotherapy she received, this risk would drop by 15%, to 11%. If she wishes to continue tamoxifen a total of 10 years, or switch to an aromatase inhibitor, she can drop the risk an additional 3%. This was very reassuring to her.  Finally, since she is not satisfied with her breast reconstruction, we are referring her to [facility] Loffler at [facility] for a second opinion.  She knows to call for any problems that may develop before next visit here.     Lowella Dell, MD   01/09/2013 7:44 PM

## 2013-01-09 NOTE — Telephone Encounter (Signed)
Spoke with pt she is going to call Dr Jerre Simon office to make appt.

## 2013-01-10 NOTE — Addendum Note (Signed)
Addended by: Billey Co on: 01/10/2013 05:10 PM   Modules accepted: Orders

## 2013-01-21 ENCOUNTER — Other Ambulatory Visit: Payer: Self-pay | Admitting: Oncology

## 2013-01-21 DIAGNOSIS — Z853 Personal history of malignant neoplasm of breast: Secondary | ICD-10-CM

## 2013-01-31 ENCOUNTER — Ambulatory Visit
Admission: RE | Admit: 2013-01-31 | Discharge: 2013-01-31 | Disposition: A | Discharge status: Home / Self Care | Payer: BC Managed Care – PPO | Source: Ambulatory Visit | Attending: Oncology | Admitting: Oncology

## 2013-01-31 DIAGNOSIS — Z853 Personal history of malignant neoplasm of breast: Secondary | ICD-10-CM

## 2013-02-08 DIAGNOSIS — W1809XA Striking against other object with subsequent fall, initial encounter: Secondary | ICD-10-CM | POA: Insufficient documentation

## 2013-02-08 DIAGNOSIS — S0990XA Unspecified injury of head, initial encounter: Secondary | ICD-10-CM | POA: Insufficient documentation

## 2013-02-08 DIAGNOSIS — Y939 Activity, unspecified: Secondary | ICD-10-CM | POA: Insufficient documentation

## 2013-02-08 DIAGNOSIS — Y929 Unspecified place or not applicable: Secondary | ICD-10-CM | POA: Insufficient documentation

## 2013-02-08 DIAGNOSIS — W010XXA Fall on same level from slipping, tripping and stumbling without subsequent striking against object, initial encounter: Secondary | ICD-10-CM | POA: Insufficient documentation

## 2013-02-09 ENCOUNTER — Encounter (HOSPITAL_COMMUNITY): Payer: Self-pay | Admitting: Emergency Medicine

## 2013-02-09 ENCOUNTER — Emergency Department (HOSPITAL_COMMUNITY)
Admission: EM | Admit: 2013-02-09 | Discharge: 2013-02-09 | Disposition: A | Discharge status: Home / Self Care | Payer: BC Managed Care – PPO | Attending: Emergency Medicine | Admitting: Emergency Medicine

## 2013-02-09 ENCOUNTER — Emergency Department (HOSPITAL_COMMUNITY)
Admission: EM | Admit: 2013-02-09 | Discharge: 2013-02-09 | Discharge status: Against Medical Advice | Payer: BC Managed Care – PPO | Attending: Emergency Medicine | Admitting: Emergency Medicine

## 2013-02-09 ENCOUNTER — Emergency Department (HOSPITAL_COMMUNITY): Payer: BC Managed Care – PPO

## 2013-02-09 DIAGNOSIS — IMO0002 Reserved for concepts with insufficient information to code with codable children: Secondary | ICD-10-CM | POA: Insufficient documentation

## 2013-02-09 DIAGNOSIS — Y9301 Activity, walking, marching and hiking: Secondary | ICD-10-CM | POA: Insufficient documentation

## 2013-02-09 DIAGNOSIS — Z23 Encounter for immunization: Secondary | ICD-10-CM | POA: Insufficient documentation

## 2013-02-09 DIAGNOSIS — Y929 Unspecified place or not applicable: Secondary | ICD-10-CM | POA: Insufficient documentation

## 2013-02-09 DIAGNOSIS — W19XXXA Unspecified fall, initial encounter: Secondary | ICD-10-CM

## 2013-02-09 DIAGNOSIS — Z862 Personal history of diseases of the blood and blood-forming organs and certain disorders involving the immune mechanism: Secondary | ICD-10-CM | POA: Insufficient documentation

## 2013-02-09 DIAGNOSIS — T148XXA Other injury of unspecified body region, initial encounter: Secondary | ICD-10-CM | POA: Insufficient documentation

## 2013-02-09 DIAGNOSIS — Z79899 Other long term (current) drug therapy: Secondary | ICD-10-CM | POA: Insufficient documentation

## 2013-02-09 DIAGNOSIS — S0990XA Unspecified injury of head, initial encounter: Secondary | ICD-10-CM | POA: Insufficient documentation

## 2013-02-09 DIAGNOSIS — Z853 Personal history of malignant neoplasm of breast: Secondary | ICD-10-CM | POA: Insufficient documentation

## 2013-02-09 DIAGNOSIS — W1809XA Striking against other object with subsequent fall, initial encounter: Secondary | ICD-10-CM | POA: Insufficient documentation

## 2013-02-09 DIAGNOSIS — Z8639 Personal history of other endocrine, nutritional and metabolic disease: Secondary | ICD-10-CM | POA: Insufficient documentation

## 2013-02-09 MED ORDER — TETANUS-DIPHTH-ACELL PERTUSSIS 5-2.5-18.5 LF-MCG/0.5 IM SUSP
0.5000 mL | Freq: Once | INTRAMUSCULAR | Status: AC
  Administered 2013-02-09: 0.5 mL via INTRAMUSCULAR
  Filled 2013-02-09: qty 0.5

## 2013-02-09 NOTE — ED Notes (Signed)
Pt. tripped and fell backwards this evening and hit her head against the pavement , brief LOC , alert and oriented , ambulatory , reports pain at back of head with abrasion . C- collar applied at triage . Pt. drank ETOH this evening .

## 2013-02-09 NOTE — ED Notes (Signed)
The pt fell and struck her head on  A rock last pm with a few seconds loc.  She was here in the ed but left without being seen.  Today the lt side of her head feels swishey but the swelling is down.no gait disturbance no numbness minimal headache also

## 2013-02-09 NOTE — ED Notes (Signed)
No answer x3

## 2013-02-09 NOTE — ED Provider Notes (Signed)
CSN: [id number]     Arrival date & time 02/09/13  1544 History   First MD Initiated Contact with Patient 02/09/13 1618     Chief Complaint  Patient presents with  . Fall   (Consider location/radiation/quality/duration/timing/severity/associated sxs/prior Treatment) HPI Comments: Patient is a 52 year old female with history of hyperlipidemia, breast cancer who presents today after a fall yesterday. She reports she was walking in high heels and lost her balance. She hit the back of her head on a rock. She lost consciousness for approximately 5 seconds. Since that time she's had a throbbing headache. She took ibuprofen which improved her symptoms mildly. She was evaluated at urgent care and was discharged home without imaging. She denies any visual disturbance, blurry vision, double vision, photophobia, nausea, vomiting, abdominal pain, chest pain, shortness of breath, neck pain, weakness, numbness, paresthesias. She notes that earlier today she noticed that the back of her head has been more "squishy". She is concerned that there is fluid at the back of her head. She is unsure of her last tetanus shot.   The history is provided by the patient. No language interpreter was used.    Past Medical History  Diagnosis Date  . Hyperlipidemia   . Gynecological examination     sees Dr. Malva Limes   . Cancer 06/03/2009    Right lumpectomy+alnd,T1bN1a,ERPR+,Her2-   Past Surgical History  Procedure Laterality Date  . Breast enhancement surgery  05/2010    reconstruction due to breast cancer  . Breast surgery  06/03/2009    Right lumpectomy & lymph node removal  . Colonoscopy  01-12-10    per Dr. Arlyce Dice, clear , repeat in 5 rys    Family History  Problem Relation Age of Onset  . Cancer Father     colon  . Colitis Brother    History  Substance Use Topics  . Smoking status: Never Smoker   . Smokeless tobacco: Never Used  . Alcohol Use: 1.8 oz/week    3 Glasses of wine per week   OB History     Grav Para Term Preterm Abortions TAB SAB Ect Mult Living                 Review of Systems  Constitutional: Negative for fever and chills.  Eyes: Negative for photophobia and visual disturbance.  Respiratory: Negative for shortness of breath.   Cardiovascular: Negative for chest pain.  Gastrointestinal: Negative for nausea, vomiting and abdominal pain.  Skin: Positive for wound.  Neurological: Positive for headaches.  All other systems reviewed and are negative.    Allergies  Atorvastatin  Home Medications   Current Outpatient Rx  Name  Route  Sig  Dispense  Refill  . aspirin 81 MG tablet   Oral   Take 81 mg by mouth as needed.          . Cholecalciferol (VITAMIN D PO)   Oral   Take 2,000 Units by mouth daily.          . Estradiol (VAGIFEM) 10 MCG TABS vaginal tablet   Vaginal   Place 1 tablet (10 mcg total) vaginally every Monday, Wednesday, and Friday.   16 tablet   12   . fish oil-omega-3 fatty acids 1000 MG capsule   Oral   Take 2 g by mouth daily.          . Multiple Vitamin (MULTIVITAMIN) tablet   Oral   Take 1 tablet by mouth daily.         Marland Kitchen  tamoxifen (NOLVADEX) 20 MG tablet   Oral   Take 1 tablet (20 mg total) by mouth daily.   30 tablet   5    BP 145/74  Pulse 109  Temp(Src) 97.7 F (36.5 C) (Oral)  Resp 18  SpO2 99% Physical Exam  Nursing note and vitals reviewed. Constitutional: She is oriented to person, place, and time. She appears well-developed and well-nourished.  Non-toxic appearance. She does not have a sickly appearance. She does not appear ill. No distress.  HENT:  Head: Normocephalic. Head is with abrasion. Head is without raccoon's eyes and without Battle's sign.    Right Ear: External ear normal.  Left Ear: External ear normal.  Nose: Nose normal.  Mouth/Throat: Oropharynx is clear and moist.  Small abrasion at the posterior scalp without surrounding erythema or drainage.  Eyes: Conjunctivae, EOM and lids are  normal. Pupils are equal, round, and reactive to light.  Neck: Trachea normal, normal range of motion and phonation normal. No spinous process tenderness and no muscular tenderness present.  Cardiovascular: Normal rate, regular rhythm, normal heart sounds, intact distal pulses and normal pulses.   Pulmonary/Chest: Effort normal and breath sounds normal. No stridor. No respiratory distress. She has no wheezes. She has no rales.  Abdominal: Soft. She exhibits no distension. There is no tenderness. There is no rigidity, no rebound and no guarding.  Musculoskeletal: Normal range of motion.  Moves all extremities without guarding. 5 out of 5 strength in all extremities.  Neurological: She is alert and oriented to person, place, and time. She has normal strength. No sensory deficit. Coordination and gait ( Not antalgic or ataxic) normal.  Finger-nose-finger normal. Strength 5 out of 5 in all extremities. Patient with steady, stable gait. She is wearing high heels and has no difficulty walking.  Skin: Skin is warm and dry. She is not diaphoretic. No erythema.  Psychiatric: She has a normal mood and affect. Her behavior is normal.    ED Course  Procedures (including critical care time) Labs Review Labs Reviewed - No data to display Imaging Review Ct Head Wo Contrast  02/09/2013   CLINICAL DATA:  A mass on head.  History of breast carcinoma.  EXAM: CT HEAD WITHOUT CONTRAST  TECHNIQUE: Contiguous axial images were obtained from the base of the skull through the vertex without intravenous contrast.  COMPARISON:  None.  FINDINGS: 2.3 cm hyperdense noncalcified subcutaneous mass in the left parietal scalp. Margins are fairly well demarcated. No significant adjacent inflammatory change. No underlying bony involvement.  There is no evidence of acute intracranial hemorrhage, brain edema, intracranial mass lesion, acute infarction, mass effect, or midline shift. Acute infarct may be inapparent on noncontrast CT.  No other intra-axial abnormalities are seen, and the ventricles and sulci are within normal limits in size and symmetry. No abnormal intracranial extra-axial fluid collections or masses are identified. No significant calvarial abnormality. No other soft tissue lesions identified.  IMPRESSION: 1. 2.3 cm left parietal scalp subcutaneous  mass. 2. Negative for bleed or other acute intracranial process.   Electronically Signed   By: Oley Balm M.D.   On: 02/09/2013 19:31    EKG Interpretation   None       MDM   1. Fall, initial encounter   2. Abrasion   3. Hematoma    Patient presents to ED after a fall yesterday. Neuro exam normal. Head CT shows subcutaneous mass which can easily be palpated on exam. Discussed these findings with the patient. TDAP  updated. Patient will continue to ice area of swelling. Return instructions given. Vital signs stable for discharge. Patient / Family / Caregiver informed of clinical course, understand medical decision-making process, and agree with plan.     Mora Bellman, PA-C 02/09/13 [phone]

## 2013-02-09 NOTE — ED Notes (Signed)
No answer in waiting room 

## 2013-02-13 NOTE — ED Provider Notes (Signed)
Medical screening examination/treatment/procedure(s) were performed by non-physician practitioner and as supervising physician I was immediately available for consultation/collaboration.  EKG Interpretation   None         Trevone Prestwood W. Doaa Kendzierski, MD 02/13/13 1527 

## 2013-03-06 ENCOUNTER — Other Ambulatory Visit: Payer: Self-pay | Admitting: *Deleted

## 2013-03-06 DIAGNOSIS — C50919 Malignant neoplasm of unspecified site of unspecified female breast: Secondary | ICD-10-CM

## 2013-03-06 MED ORDER — TAMOXIFEN CITRATE 20 MG PO TABS: 20.0000 mg | ORAL_TABLET | Freq: Every day | ORAL | Status: DC

## 2013-03-07 ENCOUNTER — Ambulatory Visit (INDEPENDENT_AMBULATORY_CARE_PROVIDER_SITE_OTHER): Payer: BC Managed Care – PPO | Admitting: Family Medicine

## 2013-03-07 ENCOUNTER — Encounter: Payer: Self-pay | Admitting: Family Medicine

## 2013-03-07 VITALS — BP 108/66 | HR 85 | Temp 99.6°F | Wt 135.0 lb

## 2013-03-07 DIAGNOSIS — S0093XA Contusion of unspecified part of head, initial encounter: Secondary | ICD-10-CM

## 2013-03-07 DIAGNOSIS — S1093XA Contusion of unspecified part of neck, initial encounter: Secondary | ICD-10-CM

## 2013-03-07 DIAGNOSIS — S0003XA Contusion of scalp, initial encounter: Secondary | ICD-10-CM

## 2013-03-07 DIAGNOSIS — H8110 Benign paroxysmal vertigo, unspecified ear: Secondary | ICD-10-CM

## 2013-03-07 DIAGNOSIS — S0083XA Contusion of other part of head, initial encounter: Secondary | ICD-10-CM

## 2013-03-07 NOTE — Progress Notes (Signed)
Pre visit review using our clinic review tool, if applicable. No additional management support is needed unless otherwise documented below in the visit note. 

## 2013-03-07 NOTE — Progress Notes (Signed)
   Subjective:    Patient ID: Melinda [place], female    DOB: 12/02/1960, 53 y.o.   MRN: [id number]  HPI Here for 4 days of mild intermittent dizziness which makes her feel as if the room was spinning. This occurs when she moves suddenly or stand up quickly or turns over in bed. She feels fine when she is still. No nausea, no HA or blurred vision. No other neurologic deficits. She is worried this may be the result of a head injury which occurred on 02-08-13 when she slipped and fell backwards, striking the back of her head on a rock. She was knocked out for a few seconds. When she came to there was bleeding from a scalp wound but she felt fine otherwise. The next day 02-09-13 at the insistence of her friends she went to the ED and had a normal exam and a normal non-contrasted head CT. She felt fine until this dizziness started 4 days ago.    Review of Systems  Constitutional: Negative.   HENT: Negative.   Eyes: Negative.   Respiratory: Negative.   Cardiovascular: Negative.   Neurological: Positive for dizziness. Negative for tremors, seizures, syncope, facial asymmetry, speech difficulty, weakness, light-headedness, numbness and headaches.       Objective:   Physical Exam  Constitutional: She is oriented to person, place, and time. She appears well-developed and well-nourished. No distress.  Alert, normal gait   HENT:  Head: Normocephalic and atraumatic.  Right Ear: External ear normal.  Left Ear: External ear normal.  Nose: Nose normal.  Mouth/Throat: Oropharynx is clear and moist.  Eyes: Conjunctivae and EOM are normal. Pupils are equal, round, and reactive to light.  Neck: Normal range of motion. Neck supple. No thyromegaly present.  Cardiovascular: Normal rate, regular rhythm, normal heart sounds and intact distal pulses.   Pulmonary/Chest: Effort normal and breath sounds normal.  Lymphadenopathy:    She has no cervical adenopathy.  Neurological: She is alert and oriented to  person, place, and time. She has normal reflexes. No cranial nerve deficit. She exhibits normal muscle tone. Coordination normal.          Assessment & Plan:  She has some vertigo which does not seem to be related to her hx of head trauma at all. I suggested she take some Zyrtec D bid for a week or two. Drink fluids. Recheck prn

## 2013-04-22 ENCOUNTER — Encounter: Payer: Self-pay | Admitting: Family Medicine

## 2013-04-22 ENCOUNTER — Ambulatory Visit (INDEPENDENT_AMBULATORY_CARE_PROVIDER_SITE_OTHER): Payer: BC Managed Care – PPO | Admitting: Family Medicine

## 2013-04-22 VITALS — BP 98/66 | HR 87 | Temp 99.1°F | Ht 61.75 in | Wt 137.0 lb

## 2013-04-22 DIAGNOSIS — J209 Acute bronchitis, unspecified: Secondary | ICD-10-CM

## 2013-04-22 MED ORDER — HYDROCODONE-HOMATROPINE 5-1.5 MG/5ML PO SYRP: 5.0000 mL | ORAL_SOLUTION | ORAL | Status: DC | PRN

## 2013-04-22 MED ORDER — AZITHROMYCIN 250 MG PO TABS: ORAL_TABLET | ORAL | Status: DC

## 2013-04-22 NOTE — Progress Notes (Signed)
   Subjective:    Patient ID: HEER JUSTISS, female    DOB: 1960-07-20, 53 y.o.   MRN: [id number]  HPI Here for one week of chest tightness and coughing up green sputum.    Review of Systems  Constitutional: Negative.   HENT: Positive for congestion.   Eyes: Negative.   Respiratory: Positive for cough.        Objective:   Physical Exam  Constitutional: She appears well-developed and well-nourished.  HENT:  Right Ear: External ear normal.  Left Ear: External ear normal.  Nose: Nose normal.  Mouth/Throat: Oropharynx is clear and moist.  Eyes: Conjunctivae are normal.  Pulmonary/Chest: Effort normal and breath sounds normal.  Lymphadenopathy:    She has no cervical adenopathy.          Assessment & Plan:  Add Mucinex.

## 2013-04-22 NOTE — Progress Notes (Signed)
Pre visit review using our clinic review tool, if applicable. No additional management support is needed unless otherwise documented below in the visit note. 

## 2013-04-29 ENCOUNTER — Encounter: Payer: Self-pay | Admitting: Family Medicine

## 2013-04-29 ENCOUNTER — Ambulatory Visit (INDEPENDENT_AMBULATORY_CARE_PROVIDER_SITE_OTHER): Payer: BC Managed Care – PPO | Admitting: Family Medicine

## 2013-04-29 VITALS — BP 100/68 | HR 107 | Temp 98.5°F | Ht 61.75 in | Wt 136.0 lb

## 2013-04-29 DIAGNOSIS — J019 Acute sinusitis, unspecified: Secondary | ICD-10-CM

## 2013-04-29 MED ORDER — METHYLPREDNISOLONE ACETATE 80 MG/ML IJ SUSP
120.0000 mg | Freq: Once | INTRAMUSCULAR | Status: AC
  Administered 2013-04-29: 120 mg via INTRAMUSCULAR

## 2013-04-29 MED ORDER — LEVOFLOXACIN 500 MG PO TABS: 500.0000 mg | ORAL_TABLET | Freq: Every day | ORAL | Status: AC

## 2013-04-29 NOTE — Progress Notes (Signed)
   Subjective:    Patient ID: Melinda [place], female    DOB: September 05, 1960, 53 y.o.   MRN: [id number]  HPI Here for continued sinus pressure , PND, and a dry cough. She was seen on 04-22-13 and was given a Zpack, but this has not helped much.    Review of Systems  Constitutional: Negative.   HENT: Positive for congestion, postnasal drip and sinus pressure.   Eyes: Negative.   Respiratory: Positive for cough.        Objective:   Physical Exam  Constitutional: She appears well-developed and well-nourished.  HENT:  Right Ear: External ear normal.  Left Ear: External ear normal.  Nose: Nose normal.  Mouth/Throat: Oropharynx is clear and moist.  Eyes: Conjunctivae are normal.  Pulmonary/Chest: Effort normal and breath sounds normal.  Lymphadenopathy:    She has no cervical adenopathy.          Assessment & Plan:  Recheck prn

## 2013-04-29 NOTE — Progress Notes (Signed)
Pre visit review using our clinic review tool, if applicable. No additional management support is needed unless otherwise documented below in the visit note. 

## 2013-04-29 NOTE — Addendum Note (Signed)
Addended by: Aggie Hacker A on: 04/29/2013 11:57 AM   Modules accepted: Orders

## 2013-06-20 ENCOUNTER — Telehealth: Payer: Self-pay | Admitting: Oncology

## 2013-06-20 NOTE — Telephone Encounter (Signed)
, °

## 2013-06-25 ENCOUNTER — Telehealth: Payer: Self-pay | Admitting: Oncology

## 2013-06-25 NOTE — Telephone Encounter (Signed)
m, °

## 2013-07-01 ENCOUNTER — Other Ambulatory Visit: Payer: BC Managed Care – PPO

## 2013-07-08 ENCOUNTER — Encounter: Payer: BC Managed Care – PPO | Admitting: Oncology

## 2013-08-12 ENCOUNTER — Other Ambulatory Visit: Payer: Self-pay | Admitting: Obstetrics and Gynecology

## 2013-08-13 LAB — CYTOLOGY - PAP

## 2013-09-30 ENCOUNTER — Other Ambulatory Visit: Payer: BC Managed Care – PPO

## 2013-10-04 ENCOUNTER — Other Ambulatory Visit: Payer: Self-pay

## 2013-10-04 DIAGNOSIS — C50511 Malignant neoplasm of lower-outer quadrant of right female breast: Secondary | ICD-10-CM

## 2013-10-07 ENCOUNTER — Other Ambulatory Visit (HOSPITAL_BASED_OUTPATIENT_CLINIC_OR_DEPARTMENT_OTHER): Payer: BC Managed Care – PPO

## 2013-10-07 ENCOUNTER — Ambulatory Visit: Payer: BC Managed Care – PPO | Admitting: Oncology

## 2013-10-07 DIAGNOSIS — C50519 Malignant neoplasm of lower-outer quadrant of unspecified female breast: Secondary | ICD-10-CM

## 2013-10-07 DIAGNOSIS — C50511 Malignant neoplasm of lower-outer quadrant of right female breast: Secondary | ICD-10-CM

## 2013-10-07 LAB — COMPREHENSIVE METABOLIC PANEL (CC13)
ALT: 21 U/L (ref 0–55)
AST: 23 U/L (ref 5–34)
Albumin: 4.1 g/dL (ref 3.5–5.0)
Alkaline Phosphatase: 61 U/L (ref 40–150)
Anion Gap: 8 mEq/L (ref 3–11)
BILIRUBIN TOTAL: 0.45 mg/dL (ref 0.20–1.20)
BUN: 21.8 mg/dL (ref 7.0–26.0)
CO2: 26 mEq/L (ref 22–29)
CREATININE: 1 mg/dL (ref 0.6–1.1)
Calcium: 9.5 mg/dL (ref 8.4–10.4)
Chloride: 106 mEq/L (ref 98–109)
GLUCOSE: 110 mg/dL (ref 70–140)
Potassium: 4.1 mEq/L (ref 3.5–5.1)
Sodium: 140 mEq/L (ref 136–145)
Total Protein: 7.1 g/dL (ref 6.4–8.3)

## 2013-10-07 LAB — CBC WITH DIFFERENTIAL/PLATELET
BASO%: 1.4 % (ref 0.0–2.0)
BASOS ABS: 0.1 10*3/uL (ref 0.0–0.1)
EOS ABS: 0.2 10*3/uL (ref 0.0–0.5)
EOS%: 4.7 % (ref 0.0–7.0)
HCT: 39.3 % (ref 34.8–46.6)
HEMOGLOBIN: 12.9 g/dL (ref 11.6–15.9)
LYMPH%: 24.7 % (ref 14.0–49.7)
MCH: 30.4 pg (ref 25.1–34.0)
MCHC: 32.9 g/dL (ref 31.5–36.0)
MCV: 92.3 fL (ref 79.5–101.0)
MONO#: 0.2 10*3/uL (ref 0.1–0.9)
MONO%: 5.2 % (ref 0.0–14.0)
NEUT%: 64 % (ref 38.4–76.8)
NEUTROS ABS: 3.1 10*3/uL (ref 1.5–6.5)
PLATELETS: 205 10*3/uL (ref 145–400)
RBC: 4.25 10*6/uL (ref 3.70–5.45)
RDW: 12.9 % (ref 11.2–14.5)
WBC: 4.8 10*3/uL (ref 3.9–10.3)
lymph#: 1.2 10*3/uL (ref 0.9–3.3)

## 2013-10-14 ENCOUNTER — Ambulatory Visit (HOSPITAL_BASED_OUTPATIENT_CLINIC_OR_DEPARTMENT_OTHER): Payer: BC Managed Care – PPO | Admitting: Oncology

## 2013-10-14 VITALS — BP 120/68 | HR 71 | Temp 98.2°F | Resp 18 | Ht 61.75 in | Wt 133.7 lb

## 2013-10-14 DIAGNOSIS — C50511 Malignant neoplasm of lower-outer quadrant of right female breast: Secondary | ICD-10-CM

## 2013-10-14 DIAGNOSIS — C50919 Malignant neoplasm of unspecified site of unspecified female breast: Secondary | ICD-10-CM

## 2013-10-14 DIAGNOSIS — M949 Disorder of cartilage, unspecified: Secondary | ICD-10-CM

## 2013-10-14 DIAGNOSIS — Z170 Estrogen receptor positive status [ER+]: Secondary | ICD-10-CM

## 2013-10-14 DIAGNOSIS — R351 Nocturia: Secondary | ICD-10-CM

## 2013-10-14 DIAGNOSIS — M899 Disorder of bone, unspecified: Secondary | ICD-10-CM

## 2013-10-14 MED ORDER — TAMOXIFEN CITRATE 20 MG PO TABS: 20.0000 mg | ORAL_TABLET | Freq: Every day | ORAL | Status: DC

## 2013-10-14 NOTE — Progress Notes (Signed)
ID: Melinda Webster OB: 1960-11-22  MR#: [id number]  ION#:[id number]  PCP: Melinda Morale, MD GYN:   SUStar Age  OTHER MD: Melinda Webster  CHIEF COMPLAINT: "I am doing fine".  HISTORY OF PRESENT ILLNESS: From Dr. Rada Webster intake note 12/31/2008:  "This woman has been in good health.  She had a screening mammogram on 12/18/2008.  This revealed an area of asymmetry in the right breast in the retroareolar location middle third.  Additional views were recommended.  This was performed on 12/25/2008 with ultrasound.  Essentially this showed an ill-defined hypodense spiculated mass 2.3 cm with some calcifications extending posteriorly.  Ultrasound confirmed the presence of this mass at the 6 to 7 o'clock position, 1.9 cm parallel to the chest wall, no evidence of abnormal lymph node.  Biopsy was performed 12/25/2008.  This showed invasive mammary carcinoma.  Prognostic panel is pending as is MRI scan.  The patient was seen by Dr. Marlou Webster who in turn referred her for possible neoadjuvant therapy given the size of the mass and its proximity to the nipple-areolar complex and the general breast size."  The patient's subsequent history is as detailed below  INTERVAL HISTORY: Melinda Webster returns today for followup of her breast cancer. She continues on tamoxifen, which she is tolerating quite well. She does have hot flashes, but "I always have been", and to don't particularly bother her. They don't wake her up at night. She doesn't have vaginal wetness issues. She did try the estrogen vaginal suppositories, but stopped because "I want to take anything I don't need to take". For the same reason she canceled her appointment at [facility] with Dr. Harle Stanford. She just didn't want anything else done that she didn't need to. What am happy as I am".  REVIEW OF SYSTEMS: She exercises regularly at least 3 times a week. She has an excellent diet. She has nocturia 2 or 3 times a night even though she is careful not to drink anything after 8 PM.  A detailed review of systems today was otherwise entirely negative.   PAST MEDICAL HISTORY: Past Medical History  Diagnosis Date  . Hyperlipidemia   . Gynecological examination     sees Dr. Freda Webster   . Cancer 06/03/2009    Right lumpectomy+alnd,T1bN1a,ERPR+,Her2-    PAST SURGICAL HISTORY: Past Surgical History  Procedure Laterality Date  . Breast enhancement surgery  05/2010    reconstruction due to breast cancer  . Breast surgery  06/03/2009    Right lumpectomy & lymph node removal  . Colonoscopy  01-12-10    per Dr. Deatra Ina, clear , repeat in 5 rys     FAMILY HISTORY Family History  Problem Relation Age of Onset  . Cancer Father     colon  . Colitis Brother    the patient's father died at the age of 46 with colon cancer, which was diagnosed when he was 54 years old. The patient's mother is living, currently 24 years old. The patient had 2 brothers, one sister. There is no history of breast or ovarian cancer in the family. The patient was tested November 2010 for the BRCA genes and was found to not carrying a mutation.  GYNECOLOGIC HISTORY:  Menarche age 71, she is Melinda Webster P2. First live birth age 66. She stopped having periods with the start of chemotherapy December of 2010. She did not use hormone replacement.  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 Her husband, Melinda Webster, also works in the business.  The patient's son works with Melinda Webster in [place]. The patient's daughter lives in [name] where she runs an office. The patient has one grandson.     ADVANCED DIRECTIVES:    HEALTH MAINTENANCE: History  Substance Use Topics  . Smoking status: Never Smoker   . Smokeless tobacco: Never Used  . Alcohol Use: 1.8 oz/week    3 Glasses of wine per week     Colonoscopy:  PAP:  Bone density: 01/31/2012, showed osteopenia  Lipid panel:  Allergies  Allergen Reactions  . Atorvastatin     REACTION: muscle aches    Current Outpatient  Prescriptions  Medication Sig Dispense Refill  . aspirin 81 MG tablet Take 81 mg by mouth as needed.       . Cholecalciferol (VITAMIN D PO) Take 2,000 Units by mouth daily.       . Estradiol (VAGIFEM) 10 MCG TABS vaginal tablet Place 1 tablet (10 mcg total) vaginally every Monday, Wednesday, and Friday.  16 tablet  12  . fish oil-omega-3 fatty acids 1000 MG capsule Take 2 g by mouth daily.       Marland Kitchen HYDROcodone-homatropine (HYDROMET) 5-1.5 MG/5ML syrup Take 5 mLs by mouth every 4 (four) hours as needed for cough.  240 mL  0  . Multiple Vitamin (MULTIVITAMIN) tablet Take 1 tablet by mouth daily.      . tamoxifen (NOLVADEX) 20 MG tablet Take 1 tablet (20 mg total) by mouth daily.  30 tablet  5   No current facility-administered medications for this visit.    OBJECTIVE: Middle-aged white woman who appears well  Filed Vitals:   10/14/13 1105  BP: 120/68  Pulse: 71  Temp: 98.2 F (36.8 C)  Resp: 18     Body mass index is 24.67 kg/(m^2).    ECOG FS:0 - Asymptomatic  Sclerae unicteric, pupils round and equal Oropharynx clear and moist, teeth in good repair No cervical or supraclavicular adenopathy Lungs no rales or rhonchi Heart regular rate and rhythm, no murmur appreciated Abd soft, nontender, positive bowel sounds MSK no focal spinal tenderness, no upper extremity lymphedema Neuro: nonfocal, well oriented, positive affect Breasts: Theright breast is status post lumpectomy and radiation. There is no evidence of local recurrence. The right axilla is benign. The left breast is unremarkable. Bilateral implants in place, with good cosmetic result   LAB RESULTS:  CMP     Component Value Date/Time   NA 140 10/07/2013 0831   NA 139 08/16/2011 0841   K 4.1 10/07/2013 0831   K 4.5 08/16/2011 0841   CL 106 03/20/2012 0947   CL 107 08/16/2011 0841   CO2 26 10/07/2013 0831   CO2 26 08/16/2011 0841   GLUCOSE 110 10/07/2013 0831   GLUCOSE 107* 03/20/2012 0947   GLUCOSE 97 08/16/2011 0841   BUN 21.8  10/07/2013 0831   BUN 15 08/16/2011 0841   CREATININE 1.0 10/07/2013 0831   CREATININE 0.91 08/16/2011 0841   CALCIUM 9.5 10/07/2013 0831   CALCIUM 9.1 08/16/2011 0841   PROT 7.1 10/07/2013 0831   PROT 6.6 08/16/2011 0841   ALBUMIN 4.1 10/07/2013 0831   ALBUMIN 4.6 08/16/2011 0841   AST 23 10/07/2013 0831   AST 21 08/16/2011 0841   ALT 21 10/07/2013 0831   ALT 25 08/16/2011 0841   ALKPHOS 61 10/07/2013 0831   ALKPHOS 60 08/16/2011 0841   BILITOT 0.45 10/07/2013 0831   BILITOT 0.5 08/16/2011 0841   GFRNONAA 85.70 10/08/2009 1033   GFRAA  Value: >  60        The eGFR has been calculated using the MDRD equation. This calculation has not been validated in all clinical situations. eGFR's persistently <60 mL/min signify possible Chronic Kidney Disease. 06/02/2009 1500    I No results found for this basename: SPEP,  UPEP,   kappa and lambda light chains    Lab Results  Component Value Date   WBC 4.8 10/07/2013   NEUTROABS 3.1 10/07/2013   HGB 12.9 10/07/2013   HCT 39.3 10/07/2013   MCV 92.3 10/07/2013   PLT 205 10/07/2013      Chemistry      Component Value Date/Time   NA 140 10/07/2013 0831   NA 139 08/16/2011 0841   K 4.1 10/07/2013 0831   K 4.5 08/16/2011 0841   CL 106 03/20/2012 0947   CL 107 08/16/2011 0841   CO2 26 10/07/2013 0831   CO2 26 08/16/2011 0841   BUN 21.8 10/07/2013 0831   BUN 15 08/16/2011 0841   CREATININE 1.0 10/07/2013 0831   CREATININE 0.91 08/16/2011 0841      Component Value Date/Time   CALCIUM 9.5 10/07/2013 0831   CALCIUM 9.1 08/16/2011 0841   ALKPHOS 61 10/07/2013 0831   ALKPHOS 60 08/16/2011 0841   AST 23 10/07/2013 0831   AST 21 08/16/2011 0841   ALT 21 10/07/2013 0831   ALT 25 08/16/2011 0841   BILITOT 0.45 10/07/2013 0831   BILITOT 0.5 08/16/2011 0841       Lab Results  Component Value Date   LABCA2 25 12/23/2010    No components found with this basename: [id number]    No results found for this basename: INR,  in the last 168 hours  Urinalysis    Component Value  Date/Time   COLORURINE yellow 10/08/2009 0938   APPEARANCEUR Clear 10/08/2009 0938   LABSPEC 1.020 10/08/2009 0938   PHURINE 7.0 10/08/2009 0938   GLUCOSEU NEGATIVE 06/02/2009 1500   HGBUR negative 10/08/2009 0938   HGBUR NEGATIVE 06/02/2009 1500   BILIRUBINUR n 05/10/2011 0910   BILIRUBINUR negative 10/08/2009 0938   KETONESUR NEGATIVE 06/02/2009 1500   PROTEINUR n 05/10/2011 0910   PROTEINUR NEGATIVE 06/02/2009 1500   UROBILINOGEN 0.2 05/10/2011 0910   UROBILINOGEN 0.2 10/08/2009 0938   NITRITE n 05/10/2011 0910   NITRITE negative 10/08/2009 0938   LEUKOCYTESUR Negative 05/10/2011 0910    STUDIES: EXAM:  DIGITAL DIAGNOSTIC LIMITED BILATERAL MAMMOGRAM WITH CAD  The patient has bilateral subpectoral saline implants. Standard and  implant displaced views were performed.  COMPARISON: Previous exams.  ACR Breast Density Category c: The breasts are heterogeneously  dense, which may obscure small masses.  FINDINGS:  No suspicious masses or calcifications are seen in either breast.  Postsurgical changes are again seen in the right breast from prior  lumpectomy. Spot compression tangential view of the lumpectomy site  in the right breast was performed, with no mammographic evidence of  locally recurrent malignancy.  Mammographic images were processed with CAD.  IMPRESSION:  No mammographic evidence of malignancy in either breast.  RECOMMENDATION:  Diagnostic mammogram is suggested in 1 year. (Code:DM-B-01Y)  I have discussed the findings and recommendations with the patient.  Results were also provided in writing at the conclusion of the  visit. If applicable, a reminder letter will be sent to the patient  regarding the next appointment.  BI-RADS CATEGORY 2: Benign Finding(s)  Electronically Signed  By: Everlean Alstrom M.D.  On: 01/31/2013 08:06  CT HEAD WITHOUT CONTRAST  Obtained following a fall with a "bump to the head". TECHNIQUE:  Contiguous axial images were obtained from the base of  the skull  through the vertex without intravenous contrast.  COMPARISON: None.  FINDINGS:  2.3 cm hyperdense noncalcified subcutaneous mass in the left  parietal scalp. Margins are fairly well demarcated. No significant  adjacent inflammatory change. No underlying bony involvement.  There is no evidence of acute intracranial hemorrhage, brain edema,  intracranial mass lesion, acute infarction, mass effect, or midline  shift. Acute infarct may be inapparent on noncontrast CT. No other  intra-axial abnormalities are seen, and the ventricles and sulci are  within normal limits in size and symmetry. No abnormal intracranial  extra-axial fluid collections or masses are identified. No  significant calvarial abnormality. No other soft tissue lesions  identified.  IMPRESSION:  1. 2.3 cm left parietal scalp subcutaneous mass.  2. Negative for bleed or other acute intracranial process.  Electronically Signed  By: Arne Cleveland M.D.  On: 02/09/2013 19:31   ASSESSMENT: 53 y.o. BRCA negative Opp woman status post biopsy of her right breast lower outer quadrant mass 12/25/2008 for an invasive ductal carcinoma, clinically T2 N0 or stage IIA, estrogen receptor and progesterone receptor both positive at approximately 50%, with an MIB-1 of 20%  (1) treated neoadjuvantly wit fluorouracil, epirubicin and cyclophosphamide for 6 cycles between December 2010 and January 2011 followed by 2 cycles of docetaxel completed February of 2011, followed by 3 doses of Abraxane completed March of 2011  (2) status post right lumpectomy and sentinel lymph node sampling April 2011 for a pT1c pN0, stage IA invasive ductal carcinoma, grade 1, estrogen and progesterone receptor positive, with an MIB-1 of 18%, and no HER-2 amplification  (3) adjuvant radiation completed 09/23/2009  (4) on tamoxifen since August of 2011  PLAN: Pryor Montes is much more positive than the last time I saw her. She still worries sometimes  possibly chemotherapy may have caused some organ damage but I certainly am not picking up any symptoms or signs of that.   The nocturia she reports is secondary to bladder spasms. That is not related to chemotherapy or to tamoxifen. I offered her referral to a urologist but she does not want to do that at this point.  The plan is to continue tamoxifen at least to August of 2016. At that point she will decide whether she wishes to continue tamoxifen for total of 10 years or switch to an aromatase inhibitor or simply discontinue anti-estrogens and followup here.    Chauncey Cruel, MD   10/14/2013 11:24 AM

## 2013-10-14 NOTE — Addendum Note (Signed)
Addended by: Renford Dills on: 10/14/2013 12:09 PM   Modules accepted: Orders, Medications

## 2013-10-17 ENCOUNTER — Other Ambulatory Visit: Payer: Self-pay | Admitting: *Deleted

## 2013-10-17 ENCOUNTER — Telehealth: Payer: Self-pay | Admitting: Oncology

## 2013-10-17 DIAGNOSIS — C50919 Malignant neoplasm of unspecified site of unspecified female breast: Secondary | ICD-10-CM

## 2013-10-17 MED ORDER — TAMOXIFEN CITRATE 20 MG PO TABS: 20.0000 mg | ORAL_TABLET | Freq: Every day | ORAL | Status: DC

## 2014-02-24 ENCOUNTER — Other Ambulatory Visit: Payer: Self-pay | Admitting: Oncology

## 2014-02-24 DIAGNOSIS — Z9882 Breast implant status: Secondary | ICD-10-CM

## 2014-02-24 DIAGNOSIS — Z9889 Other specified postprocedural states: Secondary | ICD-10-CM

## 2014-02-24 DIAGNOSIS — Z853 Personal history of malignant neoplasm of breast: Secondary | ICD-10-CM

## 2014-03-04 ENCOUNTER — Ambulatory Visit
Admission: RE | Admit: 2014-03-04 | Discharge: 2014-03-04 | Disposition: A | Discharge status: Home / Self Care | Payer: BLUE CROSS/BLUE SHIELD | Source: Ambulatory Visit | Attending: Oncology | Admitting: Oncology

## 2014-03-04 DIAGNOSIS — Z853 Personal history of malignant neoplasm of breast: Secondary | ICD-10-CM

## 2014-03-04 DIAGNOSIS — Z9889 Other specified postprocedural states: Secondary | ICD-10-CM

## 2014-03-04 DIAGNOSIS — Z9882 Breast implant status: Secondary | ICD-10-CM

## 2014-08-14 ENCOUNTER — Telehealth: Payer: Self-pay | Admitting: Family Medicine

## 2014-08-14 NOTE — Telephone Encounter (Signed)
Pt needs to schedule a office visit

## 2014-08-14 NOTE — Telephone Encounter (Signed)
S/w pt and she is scheduled

## 2014-08-14 NOTE — Telephone Encounter (Signed)
Pt call to ask for a referral to see Dr Jenkins Rouge. Pt said she was told years ago that she had a heart condition and needed to see a cardiologist and she never did

## 2014-08-19 ENCOUNTER — Encounter: Payer: Self-pay | Admitting: Family Medicine

## 2014-08-19 ENCOUNTER — Ambulatory Visit (INDEPENDENT_AMBULATORY_CARE_PROVIDER_SITE_OTHER): Payer: BLUE CROSS/BLUE SHIELD | Admitting: Family Medicine

## 2014-08-19 VITALS — BP 115/68 | HR 73 | Temp 98.5°F | Ht 61.75 in | Wt 136.0 lb

## 2014-08-19 DIAGNOSIS — R931 Abnormal findings on diagnostic imaging of heart and coronary circulation: Secondary | ICD-10-CM | POA: Diagnosis not present

## 2014-08-19 NOTE — Progress Notes (Signed)
   Subjective:    Patient ID: Melinda Webster, female    DOB: 06-Aug-1960, 54 y.o.   MRN: [id number]  HPI Here asking for a referral to cardiology to evaluate her heart valves. She recently participated in a work related health screening which included an ECHO. She was told there was a "problem" with a valve and that she should have this evaluated. I do not have access to tis report today. She feels fine and is very active. Of note, prior to getting chemotherapy in 2010 she had an ECHO which was normal except for trivial mitral regurgitation.    Review of Systems  Constitutional: Negative.   Respiratory: Negative.   Cardiovascular: Negative.   Neurological: Negative.        Objective:   Physical Exam  Constitutional: She appears well-developed and well-nourished.  Neck: No thyromegaly present.  Cardiovascular: Normal rate, regular rhythm, normal heart sounds and intact distal pulses.   No murmur heard. Pulmonary/Chest: Effort normal and breath sounds normal.  Lymphadenopathy:    She has no cervical adenopathy.          Assessment & Plan:  Recent abnormal ECHO. I told her this is something that will probably never bother her, but we will refer to cardiology for their opinion.

## 2014-08-19 NOTE — Progress Notes (Signed)
Pre visit review using our clinic review tool, if applicable. No additional management support is needed unless otherwise documented below in the visit note. 

## 2014-09-02 ENCOUNTER — Encounter: Payer: Self-pay | Admitting: Genetic Counselor

## 2014-10-06 ENCOUNTER — Other Ambulatory Visit: Payer: Self-pay | Admitting: Obstetrics and Gynecology

## 2014-10-07 ENCOUNTER — Other Ambulatory Visit: Payer: Self-pay | Admitting: *Deleted

## 2014-10-07 DIAGNOSIS — C50511 Malignant neoplasm of lower-outer quadrant of right female breast: Secondary | ICD-10-CM

## 2014-10-07 LAB — CYTOLOGY - PAP

## 2014-10-08 ENCOUNTER — Other Ambulatory Visit (HOSPITAL_BASED_OUTPATIENT_CLINIC_OR_DEPARTMENT_OTHER): Payer: BLUE CROSS/BLUE SHIELD

## 2014-10-08 DIAGNOSIS — C50511 Malignant neoplasm of lower-outer quadrant of right female breast: Secondary | ICD-10-CM

## 2014-10-08 DIAGNOSIS — C50811 Malignant neoplasm of overlapping sites of right female breast: Secondary | ICD-10-CM

## 2014-10-08 LAB — COMPREHENSIVE METABOLIC PANEL (CC13)
ALBUMIN: 4.1 g/dL (ref 3.5–5.0)
ALK PHOS: 104 U/L (ref 40–150)
ALT: 18 U/L (ref 0–55)
ANION GAP: 9 meq/L (ref 3–11)
AST: 21 U/L (ref 5–34)
BILIRUBIN TOTAL: 0.41 mg/dL (ref 0.20–1.20)
BUN: 18.8 mg/dL (ref 7.0–26.0)
CO2: 28 mEq/L (ref 22–29)
Calcium: 9.4 mg/dL (ref 8.4–10.4)
Chloride: 105 mEq/L (ref 98–109)
Creatinine: 1.2 mg/dL — ABNORMAL HIGH (ref 0.6–1.1)
EGFR: 50 mL/min/{1.73_m2} — AB (ref >90)
GLUCOSE: 108 mg/dL (ref 70–140)
POTASSIUM: 4.1 meq/L (ref 3.5–5.1)
Sodium: 142 mEq/L (ref 136–145)
TOTAL PROTEIN: 7 g/dL (ref 6.4–8.3)

## 2014-10-08 LAB — CBC WITH DIFFERENTIAL/PLATELET
BASO%: 1 % (ref 0.0–2.0)
Basophils Absolute: 0.1 10*3/uL (ref 0.0–0.1)
EOS ABS: 0.3 10*3/uL (ref 0.0–0.5)
EOS%: 5.3 % (ref 0.0–7.0)
HCT: 37.4 % (ref 34.8–46.6)
HEMOGLOBIN: 12.5 g/dL (ref 11.6–15.9)
LYMPH%: 27.6 % (ref 14.0–49.7)
MCH: 30.6 pg (ref 25.1–34.0)
MCHC: 33.4 g/dL (ref 31.5–36.0)
MCV: 91.7 fL (ref 79.5–101.0)
MONO#: 0.5 10*3/uL (ref 0.1–0.9)
MONO%: 7.9 % (ref 0.0–14.0)
NEUT#: 3.5 10*3/uL (ref 1.5–6.5)
NEUT%: 58.2 % (ref 38.4–76.8)
PLATELETS: 243 10*3/uL (ref 145–400)
RBC: 4.08 10*6/uL (ref 3.70–5.45)
RDW: 12.8 % (ref 11.2–14.5)
WBC: 6.1 10*3/uL (ref 3.9–10.3)
lymph#: 1.7 10*3/uL (ref 0.9–3.3)

## 2014-10-15 ENCOUNTER — Ambulatory Visit (HOSPITAL_BASED_OUTPATIENT_CLINIC_OR_DEPARTMENT_OTHER): Payer: BLUE CROSS/BLUE SHIELD | Admitting: Oncology

## 2014-10-15 VITALS — BP 108/54 | HR 82 | Temp 98.4°F | Resp 18 | Ht 61.75 in | Wt 130.4 lb

## 2014-10-15 DIAGNOSIS — C50511 Malignant neoplasm of lower-outer quadrant of right female breast: Secondary | ICD-10-CM

## 2014-10-15 DIAGNOSIS — Z170 Estrogen receptor positive status [ER+]: Secondary | ICD-10-CM | POA: Diagnosis not present

## 2014-10-15 DIAGNOSIS — C50811 Malignant neoplasm of overlapping sites of right female breast: Secondary | ICD-10-CM | POA: Diagnosis not present

## 2014-10-15 NOTE — Progress Notes (Signed)
ID: Melinda Webster OB: 1960/08/14  MR#: [id number]  NGE#:[id number]  PCP: Laurey Morale, MD GYN:   SUStar Age  OTHER MD: Amedeo Plenty  CHIEF COMPLAINT: Estrogen receptor positive breast cancer  CURRENT TREATMENT: Tamoxifen   HISTORY OF PRESENT ILLNESS: From Dr. Rada Hay intake note 12/31/2008:  "This woman has been in good health.  She had a screening mammogram on 12/18/2008.  This revealed an area of asymmetry in the right breast in the retroareolar location middle third.  Additional views were recommended.  This was performed on 12/25/2008 with ultrasound.  Essentially this showed an ill-defined hypodense spiculated mass 2.3 cm with some calcifications extending posteriorly.  Ultrasound confirmed the presence of this mass at the 6 to 7 o'clock position, 1.9 cm parallel to the chest wall, no evidence of abnormal lymph node.  Biopsy was performed 12/25/2008.  This showed invasive mammary carcinoma.  Prognostic panel is pending as is MRI scan.  The patient was seen by Dr. Marlou Starks who in turn referred her for possible neoadjuvant therapy given the size of the mass and its proximity to the nipple-areolar complex and the general breast size."  The patient's subsequent history is as detailed below  INTERVAL HISTORY: Melinda Webster returns today for followup of her breast cancer. She ran out of tamoxifen in March 2016 and decided not to renew it. She has not noticed a significant change since then in particular no increase in energy or decrease in hot flashes. She was obtaining the drug at a good price   REVIEW OF SYSTEMS: She continues to go to the gym 3 times a week. She has some joint soreness at times related to this. Otherwise a detailed review of systems today was entirely negative.   PAST MEDICAL HISTORY: Past Medical History  Diagnosis Date  . Hyperlipidemia   . Gynecological examination     sees Dr. Freda Munro   . Cancer 06/03/2009    Right lumpectomy+alnd,T1bN1a,ERPR+,Her2-    PAST  SURGICAL HISTORY: Past Surgical History  Procedure Laterality Date  . Breast enhancement surgery  05/2010    reconstruction due to breast cancer  . Breast surgery  06/03/2009    Right lumpectomy & lymph node removal  . Colonoscopy  01-12-10    per Dr. Deatra Ina, clear , repeat in 5 rys     FAMILY HISTORY Family History  Problem Relation Age of Onset  . Cancer Father     colon  . Colitis Brother    the patient's father died at the age of 86 with colon cancer, which was diagnosed when he was 55 years old. The patient's mother is living, currently 60 years old. The patient had 2 brothers, one sister. There is no history of breast or ovarian cancer in the family. The patient was tested November 2010 for the BRCA genes and was found to not carrying a mutation.  GYNECOLOGIC HISTORY:  Menarche age 66, she is [place] P2. First live birth age 32. She stopped having periods with the start of chemotherapy December of 2010. She did not use hormone replacement.  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 Her husband, [name], also works in the business. The patient's son works with [name] in [place]. The patient's daughter lives in [place] where she runs an office. The patient has one grandson.     ADVANCED DIRECTIVES: Not in place   HEALTH MAINTENANCE: Social History  Substance Use Topics  . Smoking status: Never Smoker   . Smokeless  tobacco: Never Used  . Alcohol Use: 1.8 oz/week    3 Glasses of wine per week     Colonoscopy:  PAP:  Bone density: 01/31/2012, showed osteopenia  Lipid panel:  Allergies  Allergen Reactions  . Atorvastatin     REACTION: muscle aches    Current Outpatient Prescriptions  Medication Sig Dispense Refill  . aspirin 81 MG tablet Take 81 mg by mouth as needed.     . Cholecalciferol (VITAMIN D PO) Take 2,000 Units by mouth daily.     . fish oil-omega-3 fatty acids 1000 MG capsule Take 2 g by mouth daily.     . Multiple Vitamin  (MULTIVITAMIN) tablet Take 1 tablet by mouth daily.    . tamoxifen (NOLVADEX) 20 MG tablet Take 1 tablet (20 mg total) by mouth daily. (Patient not taking: Reported on 08/19/2014) 30 tablet 5   No current facility-administered medications for this visit.    OBJECTIVE: Middle-aged white woman in no acute distress Filed Vitals:   10/15/14 1453  BP: 108/54  Pulse: 82  Temp: 98.4 F (36.9 C)  Resp: 18     Body mass index is 24.06 kg/(m^2).    ECOG FS:0 - Asymptomatic  Sclerae unicteric, pupils round and equal Oropharynx clear and moist-- no thrush or other lesions No cervical or supraclavicular adenopathy Lungs no rales or rhonchi Heart regular rate and rhythm Abd soft, nontender, positive bowel sounds MSK no focal spinal tenderness, no upper extremity lymphedema Neuro: nonfocal, well oriented, appropriate affect Breasts: The right breast is status post lumpectomy and radiation. There is no evidence of local recurrence. The right axilla is benign per the left breast is unremarkable.   LAB RESULTS:  CMP     Component Value Date/Time   NA 142 10/08/2014 1441   NA 139 08/16/2011 0841   K 4.1 10/08/2014 1441   K 4.5 08/16/2011 0841   CL 106 03/20/2012 0947   CL 107 08/16/2011 0841   CO2 28 10/08/2014 1441   CO2 26 08/16/2011 0841   GLUCOSE 108 10/08/2014 1441   GLUCOSE 107* 03/20/2012 0947   GLUCOSE 97 08/16/2011 0841   BUN 18.8 10/08/2014 1441   BUN 15 08/16/2011 0841   CREATININE 1.2* 10/08/2014 1441   CREATININE 0.91 08/16/2011 0841   CALCIUM 9.4 10/08/2014 1441   CALCIUM 9.1 08/16/2011 0841   PROT 7.0 10/08/2014 1441   PROT 6.6 08/16/2011 0841   ALBUMIN 4.1 10/08/2014 1441   ALBUMIN 4.6 08/16/2011 0841   AST 21 10/08/2014 1441   AST 21 08/16/2011 0841   ALT 18 10/08/2014 1441   ALT 25 08/16/2011 0841   ALKPHOS 104 10/08/2014 1441   ALKPHOS 60 08/16/2011 0841   BILITOT 0.41 10/08/2014 1441   BILITOT 0.5 08/16/2011 0841   GFRNONAA 85.70 10/08/2009 1033   GFRAA   06/02/2009 1500    >60        The eGFR has been calculated using the MDRD equation. This calculation has not been validated in all clinical situations. eGFR's persistently <60 mL/min signify possible Chronic Kidney Disease.    I No results found for: SPEP  Lab Results  Component Value Date   WBC 6.1 10/08/2014   NEUTROABS 3.5 10/08/2014   HGB 12.5 10/08/2014   HCT 37.4 10/08/2014   MCV 91.7 10/08/2014   PLT 243 10/08/2014      Chemistry      Component Value Date/Time   NA 142 10/08/2014 1441   NA 139 08/16/2011 0841  K 4.1 10/08/2014 1441   K 4.5 08/16/2011 0841   CL 106 03/20/2012 0947   CL 107 08/16/2011 0841   CO2 28 10/08/2014 1441   CO2 26 08/16/2011 0841   BUN 18.8 10/08/2014 1441   BUN 15 08/16/2011 0841   CREATININE 1.2* 10/08/2014 1441   CREATININE 0.91 08/16/2011 0841      Component Value Date/Time   CALCIUM 9.4 10/08/2014 1441   CALCIUM 9.1 08/16/2011 0841   ALKPHOS 104 10/08/2014 1441   ALKPHOS 60 08/16/2011 0841   AST 21 10/08/2014 1441   AST 21 08/16/2011 0841   ALT 18 10/08/2014 1441   ALT 25 08/16/2011 0841   BILITOT 0.41 10/08/2014 1441   BILITOT 0.5 08/16/2011 0841       Lab Results  Component Value Date   LABCA2 25 12/23/2010    No components found for: [id number]  No results for input(s): INR in the last 168 hours.  Urinalysis    Component Value Date/Time   COLORURINE yellow 10/08/2009 0938   APPEARANCEUR Clear 10/08/2009 0938   LABSPEC 1.020 10/08/2009 0938   PHURINE 7.0 10/08/2009 0938   GLUCOSEU NEGATIVE 06/02/2009 1500   HGBUR negative 10/08/2009 0938   HGBUR NEGATIVE 06/02/2009 1500   BILIRUBINUR n 05/10/2011 0910   BILIRUBINUR negative 10/08/2009 0938   KETONESUR NEGATIVE 06/02/2009 1500   PROTEINUR n 05/10/2011 0910   PROTEINUR NEGATIVE 06/02/2009 1500   UROBILINOGEN 0.2 05/10/2011 0910   UROBILINOGEN 0.2 10/08/2009 0938   NITRITE n 05/10/2011 0910   NITRITE negative 10/08/2009 0938   LEUKOCYTESUR Negative  05/10/2011 0910    STUDIES: CLINICAL DATA: Patient is a currently asymptomatic 54 year old female with a personal history of right breast cancer status post conservation therapy including lumpectomy, chemotherapy, and radiation in 2011. She has had bilateral breast implants.  EXAM: DIGITAL DIAGNOSTIC BILATERAL MAMMOGRAM WITH IMPLANTS AND CAD  COMPARISON: 01/01/2010 through 01/31/2013  ACR Breast Density Category c: The breast tissue is heterogeneously dense, which may obscure small masses.  FINDINGS: Digital bilateral diagnostic mammography was performed both with and without implant displaced technique. Patient has bilateral subpectoral saline breast implants which obscure a percentage of the breast parenchymal volumes. Implant displaced views demonstrate a heterogeneously dense parenchymal pattern which may obscure detection of small masses. Spot-compression magnification view of the lumpectomy bed in the subareolar right breast reveals stable postsurgical scarring and effacement of normal fibroglandular tissue. No new mass or suspicious microcalcification. There has been no significant interval change from comparison mammograms.  Mammographic images were processed with CAD.  IMPRESSION: Stable post lumpectomy scarring of the right breast without mammographic findings of malignancy.  RECOMMENDATION: Diagnostic mammogram is suggested in 1 year. (Code:DM-B-01Y)  I have discussed the findings and recommendations with the patient. Results were also provided in writing at the conclusion of the visit. If applicable, a reminder letter will be sent to the patient regarding the next appointment.  BI-RADS CATEGORY 2: Benign.   Electronically Signed  By: Andres Shad  On: 03/04/2014 13:31   ASSESSMENT: 54 y.o. BRCA negative  woman status post biopsy of her right breast lower outer quadrant mass 12/25/2008 for an invasive ductal carcinoma,  clinically T2 N0 or stage IIA, estrogen receptor and progesterone receptor both positive at approximately 50%, with an MIB-1 of 20%  (1) treated neoadjuvantly wit fluorouracil, epirubicin and cyclophosphamide for 6 cycles between December 2010 and January 2011 followed by 2 cycles of docetaxel completed February of 2011, followed by 3 doses of Abraxane completed March of  2011  (2) status post right lumpectomy and sentinel lymph node sampling April 2011 for a pT1c pN0, stage IA invasive ductal carcinoma, grade 1, estrogen and progesterone receptor positive, with an MIB-1 of 18%, and no HER-2 amplification  (3) adjuvant radiation completed 09/23/2009  (4) on tamoxifen since August of 2011  PLAN: Nihal only slightly jumped the gun by going off the tamoxifen March 2016. We did discuss the data that shows that an additional 5 years of tamoxifen will reduce the risk of recurrence and additional 3%. This was not motivating to her. She very much wants to be "out of the cancer business".  Of course she will need yearly mammography and a yearly physician breast exam for continuing breast cancer screening.  I will be glad to see Timberlyn again at any point if and when the need arises, but as of now we are making her no further routine appointments here.   Chauncey Cruel, MD   10/15/2014 6:02 PM

## 2014-10-22 ENCOUNTER — Encounter: Payer: Self-pay | Admitting: Cardiovascular Disease

## 2014-10-22 ENCOUNTER — Ambulatory Visit (INDEPENDENT_AMBULATORY_CARE_PROVIDER_SITE_OTHER): Payer: BLUE CROSS/BLUE SHIELD | Admitting: Cardiovascular Disease

## 2014-10-22 VITALS — BP 100/70 | HR 82 | Ht 62.0 in | Wt 136.8 lb

## 2014-10-22 DIAGNOSIS — R931 Abnormal findings on diagnostic imaging of heart and coronary circulation: Secondary | ICD-10-CM

## 2014-10-22 DIAGNOSIS — R011 Cardiac murmur, unspecified: Secondary | ICD-10-CM

## 2014-10-22 NOTE — Patient Instructions (Signed)
Medication Instructions:  Your physician recommends that you continue on your current medications as directed. Please refer to the Current Medication list given to you today.   Labwork: NONE  Testing/Procedures: Your physician has requested that you have an echocardiogram. Echocardiography is a painless test that uses sound waves to create images of your heart. It provides your doctor with information about the size and shape of your heart and how well your heart's chambers and valves are working. This procedure takes approximately one hour. There are no restrictions for this procedure.    Follow-Up: Your physician wants you to follow-up in: AS NEEDED.Marland Kitchen  You will receive a reminder letter in the mail two months in advance. If you don't receive a letter, please call our office to schedule the follow-up appointment.   Any Other Special Instructions Will Be Listed Below (If Applicable).

## 2014-10-22 NOTE — Progress Notes (Signed)
Patient ID: Melinda Webster, female   DOB: [birth year], 54 y.o.   MRN: [id number]     Cardiology Office Note   Date:  10/22/2014   ID:  THRESEA DOBLE, DOB 05/17/1960, MRN [id number]  PCP:  Laurey Morale, MD  Cardiologist:   Jenkins Rouge, MD   No chief complaint on file.     History of Present Illness: Melinda Webster is a 54 y.o. female who presents for evaluation of an abnormal echo: Apparently this was done as a health screening and Dr Sarajane Jews did not have access to report.  2010 had an echo pre chemo that was normal with trivial MR  2010 right lumpectomy with adjuvant chemo and breast reconstruction surgery  CRF elevated lipids  She is very fit and lifts weights , does aerobics and yoga.  No symptoms.  Denies previous use of stimulants, diet suppresants no history of rheumatic fever or SBE     Past Medical History  Diagnosis Date  . Hyperlipidemia   . Gynecological examination     sees Dr. Freda Munro   . Cancer 06/03/2009    Right lumpectomy+alnd,T1bN1a,ERPR+,Her2-    Past Surgical History  Procedure Laterality Date  . Breast enhancement surgery  05/2010    reconstruction due to breast cancer  . Breast surgery  06/03/2009    Right lumpectomy & lymph node removal  . Colonoscopy  01-12-10    per Dr. Deatra Ina, clear , repeat in 5 rys      Current Outpatient Prescriptions  Medication Sig Dispense Refill  . aspirin 81 MG tablet Take 81 mg by mouth as needed.     . Cholecalciferol (VITAMIN D PO) Take 2,000 Units by mouth daily.     . fish oil-omega-3 fatty acids 1000 MG capsule Take 2 g by mouth daily.     . Multiple Vitamin (MULTIVITAMIN) tablet Take 1 tablet by mouth daily.     No current facility-administered medications for this visit.    Allergies:   Atorvastatin    Social History:  The patient  reports that she has never smoked. She has never used smokeless tobacco. She reports that she drinks about 1.8 oz of alcohol per week. She reports that she does not use illicit drugs.    Family History:  The patient's family history includes Cancer in her father; Colitis in her brother.    ROS:  Please see the history of present illness.   Otherwise, review of systems are positive for none.   All other systems are reviewed and negative.    PHYSICAL EXAM: VS:  There were no vitals taken for this visit. , BMI There is no weight on file to calculate BMI. Affect appropriate Healthy:  appears stated age 37: normal Neck supple with no adenopathy JVP normal no bruits no thyromegaly Lungs clear with no wheezing and good diaphragmatic motion Heart:  S1/S2 no murmur, no rub, gallop or click PMI normal Abdomen: benighn, BS positve, no tenderness, no AAA no bruit.  No HSM or HJR Distal pulses intact with no bruits No edema Neuro non-focal Skin warm and dry No muscular weakness    EKG:  05/17/11  SR rate 61  Normal ECG  10/22/14  SR rate 82  Normal ECG    Recent Labs: 10/08/2014: ALT 18; BUN 18.8; Creatinine 1.2*; HGB 12.5; Platelets 243; Potassium 4.1; Sodium 142    Lipid Panel    Component Value Date/Time   CHOL 193 05/10/2011 0852   TRIG 129.0 05/10/2011 0852  HDL 59.40 05/10/2011 0852   CHOLHDL 3 05/10/2011 0852   VLDL 25.8 05/10/2011 0852   LDLCALC 108* 05/10/2011 0852   LDLDIRECT 134.4 10/08/2009 1033      Wt Readings from Last 3 Encounters:  10/15/14 59.149 kg (130 lb 6.4 oz)  08/19/14 61.689 kg (136 lb)  10/14/13 60.646 kg (133 lb 11.2 oz)      Other studies Reviewed: Additional studies/ records that were reviewed today include: Epic notes, Dr Sarajane Jews notes and old echo from 2010.    ASSESSMENT AND PLAN:  1.  MR:  No murmur on exam echo 2010  With trivial MR  No need for f/u echo Reassured patient   2. Breast Cancer with reconstructive surgery f/u oncology with mammography 3. Chol:   Lab Results  Component Value Date   LDLCALC 108* 05/10/2011   Update labs per Dr Sarajane Jews diet Rx   Current medicines are reviewed at length with the patient  today.  The patient does not have concerns regarding medicines.  The following changes have been made:  no change  Labs/ tests ordered today include: None  No orders of the defined types were placed in this encounter.     Disposition:   FU with me PRN     Signed, Jenkins Rouge, MD  10/22/2014 12:02 AM    [address], [place], [place]  [id number] Phone: [phone]; Fax: [phone]

## 2014-11-04 ENCOUNTER — Encounter: Payer: Self-pay | Admitting: Gastroenterology

## 2015-03-05 ENCOUNTER — Encounter: Payer: Self-pay | Admitting: Gastroenterology

## 2015-03-05 ENCOUNTER — Other Ambulatory Visit: Payer: Self-pay | Admitting: Family Medicine

## 2015-03-05 DIAGNOSIS — Z1231 Encounter for screening mammogram for malignant neoplasm of breast: Secondary | ICD-10-CM

## 2015-03-11 ENCOUNTER — Other Ambulatory Visit: Payer: Self-pay | Admitting: Family Medicine

## 2015-03-11 DIAGNOSIS — Z853 Personal history of malignant neoplasm of breast: Secondary | ICD-10-CM

## 2015-03-18 ENCOUNTER — Ambulatory Visit
Admission: RE | Admit: 2015-03-18 | Discharge: 2015-03-18 | Disposition: A | Discharge status: Home / Self Care | Payer: BLUE CROSS/BLUE SHIELD | Source: Ambulatory Visit | Attending: Family Medicine | Admitting: Family Medicine

## 2015-03-18 DIAGNOSIS — Z853 Personal history of malignant neoplasm of breast: Secondary | ICD-10-CM

## 2015-04-15 ENCOUNTER — Ambulatory Visit (AMBULATORY_SURGERY_CENTER): Payer: Self-pay | Admitting: *Deleted

## 2015-04-15 VITALS — Ht 62.0 in | Wt 141.4 lb

## 2015-04-15 DIAGNOSIS — Z800 Family history of malignant neoplasm of digestive organs: Secondary | ICD-10-CM

## 2015-04-15 NOTE — Progress Notes (Signed)
No allergies to eggs or soy. No problems with anesthesia.  Pt given Emmi instructions for colonoscopy  No oxygen use  No diet drug use  

## 2015-04-27 ENCOUNTER — Ambulatory Visit (AMBULATORY_SURGERY_CENTER): Payer: BLUE CROSS/BLUE SHIELD | Admitting: Gastroenterology

## 2015-04-27 ENCOUNTER — Encounter: Payer: Self-pay | Admitting: Gastroenterology

## 2015-04-27 VITALS — BP 109/54 | HR 77 | Temp 98.6°F | Resp 19 | Ht 62.0 in | Wt 141.0 lb

## 2015-04-27 DIAGNOSIS — Z800 Family history of malignant neoplasm of digestive organs: Secondary | ICD-10-CM

## 2015-04-27 DIAGNOSIS — Z1211 Encounter for screening for malignant neoplasm of colon: Secondary | ICD-10-CM | POA: Diagnosis not present

## 2015-04-27 DIAGNOSIS — D123 Benign neoplasm of transverse colon: Secondary | ICD-10-CM | POA: Diagnosis not present

## 2015-04-27 HISTORY — PX: COLONOSCOPY W/ POLYPECTOMY: SHX1380

## 2015-04-27 MED ORDER — SODIUM CHLORIDE 0.9 % IV SOLN: 500.0000 mL | INTRAVENOUS | Status: DC

## 2015-04-27 NOTE — Patient Instructions (Signed)
YOU HAD AN ENDOSCOPIC PROCEDURE TODAY AT [facility]:   Refer to the procedure report that was given to you for any specific questions about what was found during the examination.  If the procedure report does not answer your questions, please call your gastroenterologist to clarify.  If you requested that your care partner not be given the details of your procedure findings, then the procedure report has been included in a sealed envelope for you to review at your convenience later.  YOU SHOULD EXPECT: Some feelings of bloating in the abdomen. Passage of more gas than usual.  Walking can help get rid of the air that was put into your GI tract during the procedure and reduce the bloating. If you had a lower endoscopy (such as a colonoscopy or flexible sigmoidoscopy) you may notice spotting of blood in your stool or on the toilet paper. If you underwent a bowel prep for your procedure, you may not have a normal bowel movement for a few days.  Please Note:  You might notice some irritation and congestion in your nose or some drainage.  This is from the oxygen used during your procedure.  There is no need for concern and it should clear up in a day or so.  SYMPTOMS TO REPORT IMMEDIATELY:   Following lower endoscopy (colonoscopy or flexible sigmoidoscopy):  Excessive amounts of blood in the stool  Significant tenderness or worsening of abdominal pains  Swelling of the abdomen that is new, acute  Fever of 100F or higher   For urgent or emergent issues, a gastroenterologist can be reached at any hour by calling [phone].   DIET: Your first meal following the procedure should be a small meal and then it is ok to progress to your normal diet. Heavy or fried foods are harder to digest and may make you feel nauseous or bloated.  Likewise, meals heavy in dairy and vegetables can increase bloating.  Drink plenty of fluids but you should avoid alcoholic beverages for 24  hours.  ACTIVITY:  You should plan to take it easy for the rest of today and you should NOT DRIVE or use heavy machinery until tomorrow (because of the sedation medicines used during the test).    FOLLOW UP: Our staff will call the number listed on your records the next business day following your procedure to check on you and address any questions or concerns that you may have regarding the information given to you following your procedure. If we do not reach you, we will leave a message.  However, if you are feeling well and you are not experiencing any problems, there is no need to return our call.  We will assume that you have returned to your regular daily activities without incident.  If any biopsies were taken you will be contacted by phone or by letter within the next 1-3 weeks.  Please call us at [phone] if you have not heard about the biopsies in 3 weeks.    SIGNATURES/CONFIDENTIALITY: You and/or your care partner have signed paperwork which will be entered into your electronic medical record.  These signatures attest to the fact that that the information above on your After Visit Summary has been reviewed and is understood.  Full responsibility of the confidentiality of this discharge information lies with you and/or your care-partner.  Polyp-handout given  Repeat colonoscopy in 5 years 2022.

## 2015-04-27 NOTE — Progress Notes (Signed)
Called to room to assist during endoscopic procedure.  Patient ID and intended procedure confirmed with present staff. Received instructions for my participation in the procedure from the performing physician.  

## 2015-04-27 NOTE — Op Note (Signed)
[facility]  [address] [place], [id number]   COLONOSCOPY PROCEDURE REPORT  PATIENT: Melinda Webster, Melinda Webster  MR#: [id number] BIRTHDATE: 04/28/1960 , 61  yrs. old GENDER: female ENDOSCOPIST: Wilfrid Lund, MD REFERRED [id number] Fry, MD PROCEDURE DATE:  04/27/2015 PROCEDURE:   Colonoscopy, screening and Colonoscopy with snare polypectomy First Screening Colonoscopy - Avg.  risk and is 50 yrs.  old or older - No.  Prior Negative Screening - Now for repeat screening. Above average risk  History of Adenoma - Now for follow-up colonoscopy & has been > or = to 3 yrs.  N/A  Polyps removed today? Yes ASA CLASS:   Class II INDICATIONS:Screening for colonic neoplasia and FH Colon or Rectal Adenocarcinoma.  (father, age 44) MEDICATIONS: Monitored anesthesia care and Propofol 200 mg IV  DESCRIPTION OF PROCEDURE:   After the risks benefits and alternatives of the procedure were thoroughly explained, informed consent was obtained.  The digital rectal exam revealed no abnormalities of the rectum.   The [id number]  endoscope was introduced through the anus and advanced to the cecum, which was identified by both the appendix and ileocecal valve. No adverse events experienced.   The quality of the prep was excellent. (Suprep was used)  The instrument was then slowly withdrawn as the colon was fully examined. Estimated blood loss is zero unless otherwise noted in this procedure report.      COLON FINDINGS: A sessile polyp measuring 5 mm in size was found in the distal transverse colon.  A polypectomy was performed with a cold snare.  The resection was complete, the polyp tissue was completely retrieved and sent to histology.  Retroflexed views revealed no abnormalities. The time to cecum = 3.1 Withdrawal time = 8.3   The scope was withdrawn and the procedure completed. COMPLICATIONS: There were no immediate complications.  ENDOSCOPIC IMPRESSION: Sessile polyp was found  in the transverse colon; polypectomy was performed with a cold snare  RECOMMENDATIONS: Repeat colonoscopy in 5 years  eSigned:  Wilfrid Lund, MD 04/27/2015 11:33 AM   cc:

## 2015-04-27 NOTE — Progress Notes (Signed)
To Recovery, report to Mirts, RN, VSS

## 2015-04-28 ENCOUNTER — Telehealth: Payer: Self-pay | Admitting: *Deleted

## 2015-04-28 NOTE — Telephone Encounter (Signed)
Left message on f/u call 

## 2015-05-01 ENCOUNTER — Encounter: Payer: Self-pay | Admitting: Gastroenterology

## 2015-05-22 ENCOUNTER — Telehealth: Payer: Self-pay | Admitting: Gastroenterology

## 2015-05-22 NOTE — Telephone Encounter (Signed)
The pt was notified that the polyp was NOT cancerous and was removed.  She is aware to call if she develops any concerns or symptoms prior to the appt.

## 2015-10-13 ENCOUNTER — Other Ambulatory Visit: Payer: Self-pay | Admitting: Obstetrics and Gynecology

## 2015-10-14 LAB — CYTOLOGY - PAP

## 2015-10-29 ENCOUNTER — Encounter: Payer: Self-pay | Admitting: Family Medicine

## 2015-10-29 ENCOUNTER — Ambulatory Visit (INDEPENDENT_AMBULATORY_CARE_PROVIDER_SITE_OTHER): Payer: BLUE CROSS/BLUE SHIELD | Admitting: Family Medicine

## 2015-10-29 VITALS — BP 126/68 | HR 113 | Temp 98.7°F | Wt 132.1 lb

## 2015-10-29 DIAGNOSIS — J321 Chronic frontal sinusitis: Secondary | ICD-10-CM | POA: Diagnosis not present

## 2015-10-29 DIAGNOSIS — R059 Cough, unspecified: Secondary | ICD-10-CM

## 2015-10-29 DIAGNOSIS — R05 Cough: Secondary | ICD-10-CM

## 2015-10-29 MED ORDER — AMOXICILLIN-POT CLAVULANATE 875-125 MG PO TABS: 1.0000 | ORAL_TABLET | Freq: Two times a day (BID) | ORAL | 0 refills | Status: DC

## 2015-10-29 MED ORDER — HYDROCODONE-HOMATROPINE 5-1.5 MG/5ML PO SYRP: 5.0000 mL | ORAL_SOLUTION | Freq: Three times a day (TID) | ORAL | 0 refills | Status: DC | PRN

## 2015-10-29 MED ORDER — BENZONATATE 100 MG PO CAPS: 100.0000 mg | ORAL_CAPSULE | Freq: Three times a day (TID) | ORAL | 0 refills | Status: DC
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
## 2015-10-29 NOTE — Progress Notes (Signed)
Pre visit review using our clinic review tool, if applicable. No additional management support is needed unless otherwise documented below in the visit note. 

## 2015-10-29 NOTE — Patient Instructions (Signed)
Please treat symptoms for 48 to 72 hours and do not fill antibiotic unless symptoms are not improving or worsen.  If you develop new symptoms, please follow up for further evaluation.   Sinusitis, Adult Sinusitis is redness, soreness, and inflammation of the paranasal sinuses. Paranasal sinuses are air pockets within the bones of your face. They are located beneath your eyes, in the middle of your forehead, and above your eyes. In healthy paranasal sinuses, mucus is able to drain out, and air is able to circulate through them by way of your nose. However, when your paranasal sinuses are inflamed, mucus and air can become trapped. This can allow bacteria and other germs to grow and cause infection. Sinusitis can develop quickly and last only a short time (acute) or continue over a long period (chronic). Sinusitis that lasts for more than 12 weeks is considered chronic. CAUSES Causes of sinusitis include:  Allergies.  Structural abnormalities, such as displacement of the cartilage that separates your nostrils (deviated septum), which can decrease the air flow through your nose and sinuses and affect sinus drainage.  Functional abnormalities, such as when the small hairs (cilia) that line your sinuses and help remove mucus do not work properly or are not present. SIGNS AND SYMPTOMS Symptoms of acute and chronic sinusitis are the same. The primary symptoms are pain and pressure around the affected sinuses. Other symptoms include:  Upper toothache.  Earache.  Headache.  Bad breath.  Decreased sense of smell and taste.  A cough, which worsens when you are lying flat.  Fatigue.  Fever.  Thick drainage from your nose, which often is green and may contain pus (purulent).  Swelling and warmth over the affected sinuses. DIAGNOSIS Your health care provider will perform a physical exam. During your exam, your health care provider may perform any of the following to help determine if you  have acute sinusitis or chronic sinusitis:  Look in your nose for signs of abnormal growths in your nostrils (nasal polyps).  Tap over the affected sinus to check for signs of infection.  View the inside of your sinuses using an imaging device that has a light attached (endoscope). If your health care provider suspects that you have chronic sinusitis, one or more of the following tests may be recommended:  Allergy tests.  Nasal culture. A sample of mucus is taken from your nose, sent to a lab, and screened for bacteria.  Nasal cytology. A sample of mucus is taken from your nose and examined by your health care provider to determine if your sinusitis is related to an allergy. TREATMENT Most cases of acute sinusitis are related to a viral infection and will resolve on their own within 10 days. Sometimes, medicines are prescribed to help relieve symptoms of both acute and chronic sinusitis. These may include pain medicines, decongestants, nasal steroid sprays, or saline sprays. However, for sinusitis related to a bacterial infection, your health care provider will prescribe antibiotic medicines. These are medicines that will help kill the bacteria causing the infection. Rarely, sinusitis is caused by a fungal infection. In these cases, your health care provider will prescribe antifungal medicine. For some cases of chronic sinusitis, surgery is needed. Generally, these are cases in which sinusitis recurs more than 3 times per year, despite other treatments. HOME CARE INSTRUCTIONS  Drink plenty of water. Water helps thin the mucus so your sinuses can drain more easily.  Use a humidifier.  Inhale steam 3-4 times a day (for example, sit in  the bathroom with the shower running).  Apply a warm, moist washcloth to your face 3-4 times a day, or as directed by your health care provider.  Use saline nasal sprays to help moisten and clean your sinuses.  Take medicines only as directed by your health  care provider.  If you were prescribed either an antibiotic or antifungal medicine, finish it all even if you start to feel better. SEEK IMMEDIATE MEDICAL CARE IF:  You have increasing pain or severe headaches.  You have nausea, vomiting, or drowsiness.  You have swelling around your face.  You have vision problems.  You have a stiff neck.  You have difficulty breathing.   This information is not intended to replace advice given to you by your health care provider. Make sure you discuss any questions you have with your health care provider.   Document Released: 02/07/2005 Document Revised: 02/28/2014 Document Reviewed: 02/22/2011 Elsevier Interactive Patient Education [employer].

## 2016-03-14 ENCOUNTER — Other Ambulatory Visit: Payer: Self-pay | Admitting: Obstetrics and Gynecology

## 2016-03-14 DIAGNOSIS — Z853 Personal history of malignant neoplasm of breast: Secondary | ICD-10-CM

## 2016-03-22 ENCOUNTER — Ambulatory Visit
Admission: RE | Admit: 2016-03-22 | Discharge: 2016-03-22 | Disposition: A | Discharge status: Home / Self Care | Payer: 59 | Source: Ambulatory Visit | Attending: Obstetrics and Gynecology | Admitting: Obstetrics and Gynecology

## 2016-03-22 DIAGNOSIS — Z853 Personal history of malignant neoplasm of breast: Secondary | ICD-10-CM

## 2017-04-04 ENCOUNTER — Other Ambulatory Visit: Payer: Self-pay | Admitting: Family Medicine

## 2017-04-04 DIAGNOSIS — M7989 Other specified soft tissue disorders: Secondary | ICD-10-CM

## 2017-04-05 ENCOUNTER — Other Ambulatory Visit: Payer: Self-pay | Admitting: Obstetrics and Gynecology

## 2017-04-05 DIAGNOSIS — Z1231 Encounter for screening mammogram for malignant neoplasm of breast: Secondary | ICD-10-CM

## 2017-04-06 ENCOUNTER — Ambulatory Visit
Admission: RE | Admit: 2017-04-06 | Discharge: 2017-04-06 | Disposition: A | Discharge status: Home / Self Care | Payer: 59 | Source: Ambulatory Visit | Attending: Family Medicine | Admitting: Family Medicine

## 2017-04-06 DIAGNOSIS — M7989 Other specified soft tissue disorders: Secondary | ICD-10-CM

## 2017-04-24 ENCOUNTER — Ambulatory Visit
Admission: RE | Admit: 2017-04-24 | Discharge: 2017-04-24 | Disposition: A | Discharge status: Home / Self Care | Payer: 59 | Source: Ambulatory Visit | Attending: Obstetrics and Gynecology | Admitting: Obstetrics and Gynecology

## 2017-04-24 DIAGNOSIS — Z1231 Encounter for screening mammogram for malignant neoplasm of breast: Secondary | ICD-10-CM

## 2017-04-26 NOTE — Progress Notes (Signed)
This encounter was created in error - please disregard.

## 2017-05-31 ENCOUNTER — Encounter (HOSPITAL_COMMUNITY): Admission: RE | Payer: Self-pay | Source: Ambulatory Visit

## 2017-05-31 ENCOUNTER — Ambulatory Visit (HOSPITAL_COMMUNITY): Admission: RE | Admit: 2017-05-31 | Payer: 59 | Source: Ambulatory Visit | Admitting: General Surgery

## 2017-05-31 SURGERY — EXCISION LIPOMA
Anesthesia: General | Laterality: Left

## 2017-06-19 ENCOUNTER — Other Ambulatory Visit: Payer: Self-pay | Admitting: General Surgery

## 2017-08-15 ENCOUNTER — Other Ambulatory Visit: Payer: Self-pay | Admitting: Dermatology

## 2017-08-15 DIAGNOSIS — C439 Malignant melanoma of skin, unspecified: Secondary | ICD-10-CM

## 2017-08-15 HISTORY — DX: Malignant melanoma of skin, unspecified: C43.9

## 2017-09-08 ENCOUNTER — Other Ambulatory Visit: Payer: Self-pay | Admitting: Dermatology

## 2017-10-05 ENCOUNTER — Encounter: Payer: Self-pay | Admitting: Genetics

## 2018-03-28 ENCOUNTER — Other Ambulatory Visit: Payer: Self-pay | Admitting: Obstetrics and Gynecology

## 2018-03-28 DIAGNOSIS — Z1231 Encounter for screening mammogram for malignant neoplasm of breast: Secondary | ICD-10-CM

## 2018-05-01 ENCOUNTER — Ambulatory Visit
Admission: RE | Admit: 2018-05-01 | Discharge: 2018-05-01 | Disposition: A | Discharge status: Home / Self Care | Payer: 59 | Source: Ambulatory Visit | Attending: Obstetrics and Gynecology | Admitting: Obstetrics and Gynecology

## 2018-05-01 DIAGNOSIS — Z1231 Encounter for screening mammogram for malignant neoplasm of breast: Secondary | ICD-10-CM

## 2018-09-13 ENCOUNTER — Encounter: Payer: Self-pay | Admitting: *Deleted

## 2019-05-09 ENCOUNTER — Other Ambulatory Visit: Payer: Self-pay | Admitting: Family Medicine

## 2019-05-09 DIAGNOSIS — Z1231 Encounter for screening mammogram for malignant neoplasm of breast: Secondary | ICD-10-CM

## 2019-06-04 ENCOUNTER — Other Ambulatory Visit: Payer: Self-pay

## 2019-06-04 ENCOUNTER — Ambulatory Visit
Admission: RE | Admit: 2019-06-04 | Discharge: 2019-06-04 | Disposition: A | Discharge status: Home / Self Care | Payer: 59 | Source: Ambulatory Visit | Attending: Family Medicine | Admitting: Family Medicine

## 2019-06-04 DIAGNOSIS — Z1231 Encounter for screening mammogram for malignant neoplasm of breast: Secondary | ICD-10-CM

## 2019-10-04 ENCOUNTER — Encounter: Payer: Self-pay | Admitting: Genetic Counselor

## 2020-06-02 ENCOUNTER — Other Ambulatory Visit: Payer: Self-pay | Admitting: Family Medicine

## 2020-06-02 DIAGNOSIS — Z1231 Encounter for screening mammogram for malignant neoplasm of breast: Secondary | ICD-10-CM

## 2020-07-10 ENCOUNTER — Other Ambulatory Visit (HOSPITAL_COMMUNITY): Payer: Self-pay | Admitting: Obstetrics

## 2020-07-10 ENCOUNTER — Other Ambulatory Visit (HOSPITAL_COMMUNITY): Payer: Self-pay | Admitting: Family Medicine

## 2020-07-12 DIAGNOSIS — R931 Abnormal findings on diagnostic imaging of heart and coronary circulation: Secondary | ICD-10-CM | POA: Insufficient documentation

## 2020-07-16 ENCOUNTER — Ambulatory Visit (HOSPITAL_BASED_OUTPATIENT_CLINIC_OR_DEPARTMENT_OTHER)
Admission: RE | Admit: 2020-07-16 | Discharge: 2020-07-16 | Disposition: A | Discharge status: Home / Self Care | Payer: Self-pay | Source: Ambulatory Visit | Attending: Family Medicine | Admitting: Family Medicine

## 2020-07-16 ENCOUNTER — Other Ambulatory Visit: Payer: Self-pay

## 2020-07-16 DIAGNOSIS — E785 Hyperlipidemia, unspecified: Secondary | ICD-10-CM | POA: Insufficient documentation

## 2020-07-16 DIAGNOSIS — Z136 Encounter for screening for cardiovascular disorders: Secondary | ICD-10-CM | POA: Insufficient documentation

## 2020-08-04 ENCOUNTER — Ambulatory Visit
Admission: RE | Admit: 2020-08-04 | Discharge: 2020-08-04 | Disposition: A | Discharge status: Home / Self Care | Payer: 59 | Source: Ambulatory Visit | Attending: Family Medicine | Admitting: Family Medicine

## 2020-08-04 ENCOUNTER — Other Ambulatory Visit: Payer: Self-pay

## 2020-08-04 DIAGNOSIS — Z1231 Encounter for screening mammogram for malignant neoplasm of breast: Secondary | ICD-10-CM

## 2020-10-06 ENCOUNTER — Encounter: Payer: Self-pay | Admitting: Gastroenterology

## 2020-10-09 NOTE — Progress Notes (Addendum)
CARDIOLOGY CONSULT NOTE       Patient ID: Melinda Webster MRN: [id number] DOB/AGE: [birth year] 60 y.o.  Admit date: (Not on file) Referring Physician: Orland Mustard Primary Physician: London Pepper, MD Primary Cardiologist: New Reason for Consultation: CAD  Active Problems:   * No active hospital problems. *   HPI:  60 y.o. referred by Dr Orland Mustard for CAD. History of breast cancer, HLD, insomnia She has been intolerant to lipitor with myalgias and vomiting Calcium score done 07/16/20 with total 92.6 89 th percentile for age/sex mostly in RCA No clinical symptoms or history of vascular disease _0  She is from [place] Married her HS sweet heart and now divorced Has two older children And grand children Normal baseline ECG and no cardiac symptoms   Got labs from primary after visit HDL 62 LDL 174 TC 258 Triglycerides 125  Drawn 07/06/20   ROS All other systems reviewed and negative except as noted above  Past Medical History:  Diagnosis Date   Allergy    Cancer ([place]) 06/03/2009   Right lumpectomy+alnd,T1bN1a,ERPR+,Her2-   Gynecological examination    sees Dr. Freda Munro    Hyperlipidemia    pt denies   Melanoma ([place]) 08/15/2017   IN SITU- RIGHT THIGH- TX EXCISION    Family History  Problem Relation Age of Onset   Cancer Father        colon   Colon cancer Father 53   Colitis Brother     Social History   Socioeconomic History   Marital status: Married    Spouse name: Not on file   Number of children: Not on file   Years of education: Not on file   Highest education level: Not on file  Occupational History   Not on file  Tobacco Use   Smoking status: Never   Smokeless tobacco: Never  Substance and Sexual Activity   Alcohol use: Yes    Alcohol/week: 3.0 standard drinks    Types: 3 Glasses of wine per week   Drug use: No   Sexual activity: Not on file  Other Topics Concern   Not on file  Social History Narrative   Not on file   Social Determinants of Health    Financial Resource Strain: Not on file  Food Insecurity: Not on file  Transportation Needs: Not on file  Physical Activity: Not on file  Stress: Not on file  Social Connections: Not on file  Intimate Partner Violence: Not on file    Past Surgical History:  Procedure Laterality Date   BREAST ENHANCEMENT SURGERY  05/2010   reconstruction due to breast cancer   BREAST LUMPECTOMY Right 2011   BREAST SURGERY  06/03/2009   Right lumpectomy & lymph node removal   [place], 1989   COLONOSCOPY  01-12-10   per Dr. Deatra Ina, clear , repeat in 5 rys       Current Outpatient Medications:    amoxicillin-clavulanate (AUGMENTIN) 875-125 MG tablet, Take 1 tablet by mouth 2 (two) times daily., Disp: 20 tablet, Rfl: 0   aspirin 81 MG tablet, Take 81 mg by mouth as needed for pain. , Disp: , Rfl:    b complex vitamins tablet, Take 1 tablet by mouth daily., Disp: , Rfl:    benzonatate (TESSALON) 100 MG capsule, Take 1 capsule (100 mg total) by mouth 3 (three) times daily., Disp: 20 capsule, Rfl: 0   Cholecalciferol (VITAMIN D PO), Take 2,000 Units by mouth daily. , Disp: , Rfl:  fish oil-omega-3 fatty acids 1000 MG capsule, Take 2 g by mouth daily. , Disp: , Rfl:    HYDROcodone-homatropine (HYCODAN) 5-1.5 MG/5ML syrup, Take 5 mLs by mouth every 8 (eight) hours as needed for cough., Disp: 120 mL, Rfl: 0   LOTEMAX 0.5 % GEL, Reported on 04/27/2015, Disp: , Rfl: 0   Multiple Vitamin (MULTIVITAMIN) tablet, Take 1 tablet by mouth daily., Disp: , Rfl:    Multiple Vitamins-Minerals (OCUVITE PO), Take by mouth daily., Disp: , Rfl:     Physical Exam: There were no vitals taken for this visit.    Affect appropriate Healthy:  appears stated age 60: normal Neck supple with no adenopathy JVP normal no bruits no thyromegaly Lungs clear with no wheezing and good diaphragmatic motion Heart:  S1/S2 no murmur, no rub, gallop or click PMI normal Abdomen: benighn, BS positve, no tenderness, no  AAA no bruit.  No HSM or HJR Distal pulses intact with no bruits No edema Neuro non-focal Skin warm and dry No muscular weakness   Labs:   Lab Results  Component Value Date   WBC 6.1 10/08/2014   HGB 12.5 10/08/2014   HCT 37.4 10/08/2014   MCV 91.7 10/08/2014   PLT 243 10/08/2014   No results for input(s): NA, K, CL, CO2, BUN, CREATININE, CALCIUM, PROT, BILITOT, ALKPHOS, ALT, AST, GLUCOSE in the last 168 hours.  Invalid input(s): LABALBU No results found for: CKTOTAL, CKMB, CKMBINDEX, TROPONINI  Lab Results  Component Value Date   CHOL 193 05/10/2011   CHOL 206 (H) 10/08/2009   Lab Results  Component Value Date   HDL 59.40 05/10/2011   HDL 56.90 10/08/2009   Lab Results  Component Value Date   LDLCALC 108 (H) 05/10/2011   Lab Results  Component Value Date   TRIG 129.0 05/10/2011   TRIG 98.0 10/08/2009   Lab Results  Component Value Date   CHOLHDL 3 05/10/2011   CHOLHDL 4 10/08/2009   Lab Results  Component Value Date   LDLDIRECT 134.4 10/08/2009      Radiology: No results found.  EKG: SR rate 82 normal    ASSESSMENT AND PLAN:  CAD:  noted with high calcium score for age. No clinical symptoms Has been intolerant to lipitor and two statins as well as zetia. Will get her lab results but she indicates her LDL has been very high in past Discussed Nexlitol and Repatha with her shared decision making favor bi monthly injection with Repatha She will look into insurance Discussed the auto inject procedure f/u with Lipid clinic once we have her lab results    Lipid Clinic Referral Start Repatha   Signed: Jenkins Rouge 10/09/2020, 4:49 PM

## 2020-10-14 ENCOUNTER — Other Ambulatory Visit: Payer: Self-pay

## 2020-10-14 ENCOUNTER — Ambulatory Visit: Payer: BC Managed Care – PPO | Admitting: Cardiovascular Disease

## 2020-10-14 ENCOUNTER — Encounter: Payer: Self-pay | Admitting: Cardiovascular Disease

## 2020-10-14 VITALS — BP 112/82 | HR 82 | Ht 62.0 in | Wt 139.0 lb

## 2020-10-14 DIAGNOSIS — E785 Hyperlipidemia, unspecified: Secondary | ICD-10-CM | POA: Diagnosis not present

## 2020-10-14 DIAGNOSIS — I2510 Atherosclerotic heart disease of native coronary artery without angina pectoris: Secondary | ICD-10-CM | POA: Diagnosis not present

## 2020-10-14 DIAGNOSIS — I2583 Coronary atherosclerosis due to lipid rich plaque: Secondary | ICD-10-CM

## 2020-10-14 NOTE — Patient Instructions (Addendum)
Medication Instructions:  *If you need a refill on your cardiac medications before your next appointment, please call your pharmacy*  Lab Work: If you have labs (blood work) drawn today and your tests are completely normal, you will receive your results only by: [place] (if you have MyChart) OR A paper copy in the mail If you have any lab test that is abnormal or we need to change your treatment, we will call you to review the results.  Follow-Up: At [facility], you and your health needs are our priority.  As part of our continuing mission to provide you with exceptional heart care, we have created designated Provider Care Teams.  These Care Teams include your primary Cardiologist (physician) and Advanced Practice Providers (APPs -  Physician Assistants and Nurse Practitioners) who all work together to provide you with the care you need, when you need it.  We recommend signing up for the patient portal called "MyChart".  Sign up information is provided on this After Visit Summary.  MyChart is used to connect with patients for Virtual Visits (Telemedicine).  Patients are able to view lab/test results, encounter notes, upcoming appointments, etc.  Non-urgent messages can be sent to your provider as well.   To learn more about what you can do with MyChart, go to [email or web address].    Your next appointment:   3 month(s)  The format for your next appointment:   In Person  Provider:   You may see Dr. Johnsie Cancel or one of the following Advanced Practice Providers on your designated Care Team:   Cecilie Kicks, NP  Other Instructions You have been referred to the Lipid Clinic to discuss PSK9.

## 2020-10-27 DIAGNOSIS — L578 Other skin changes due to chronic exposure to nonionizing radiation: Secondary | ICD-10-CM | POA: Diagnosis not present

## 2020-10-27 DIAGNOSIS — L821 Other seborrheic keratosis: Secondary | ICD-10-CM | POA: Diagnosis not present

## 2020-10-29 ENCOUNTER — Other Ambulatory Visit: Payer: Self-pay

## 2020-10-29 ENCOUNTER — Telehealth: Payer: Self-pay | Admitting: Family Medicine

## 2020-10-29 ENCOUNTER — Ambulatory Visit: Payer: BC Managed Care – PPO | Admitting: Pharmacist

## 2020-10-29 DIAGNOSIS — E782 Mixed hyperlipidemia: Secondary | ICD-10-CM

## 2020-10-29 DIAGNOSIS — R931 Abnormal findings on diagnostic imaging of heart and coronary circulation: Secondary | ICD-10-CM

## 2020-10-29 NOTE — Patient Instructions (Addendum)
Your LDL is 174 and your goal is < 70  I will submit information to your insurance for [place] and let you know when I hear back.   Try to track down the following: LDL cholesterol of at least 220 in the past, and names of other statin medications you've tried (your insurance is asking if you've tried rosuvastatin/Crestor specifically) and give me a call at [phone]  Repatha is a subcutaneous injection given once every 2 weeks in the fatty tissue of your stomach or upper outer thigh. Store the medication in the fridge. You can let your dose warm up to room temperature for 30 minutes before injecting if you prefer. Repatha will lower your LDL cholesterol by 60%

## 2020-10-29 NOTE — Progress Notes (Addendum)
Patient ID: Melinda Webster                 DOB: [birth year]                    MRN: [id number]     HPI: Melinda Webster is a 60 y.o. female patient referred to lipid clinic by Melinda Webster. PMH is significant for HLD, elevated calcium score of 92.6 (89th percentile for age, race, and sex matched controls), and breast cancer. She is intolerant to multiple statins and ezetimibe and was referred to lipid clinic to discuss PCSK9i therapy.  Pt presents today in good spirits. She has tried 2-3 statins in the past as well as ezetimibe and experienced myalgias with all after 2-4 weeks of therapy. Initially tried atorvastatin about 10 years ago and after about a month, had trouble moving her legs. She then tried rosuvastatin 41m daily which caused myalgias in her arms. She thinks she may have tried a 3rd statin as well that also caused myalgias. Then tried ezetimibe 3 days per week which also caused myalgias. Her PCP prescribed Welchol but she didn't start it secondary to pill burden. She is interested in trying Repatha.  Reports heart healthy diet and exercise at least 3 days a week. Many family members have elevated cholesterol and take meds, but she doesn't report any family history of premature CAD. Highest baseline LDL I can find in our reports are 201 from 2020. She recalls even higher readings of LDL > 220 in the past although has not been able to track down records. No hx of any chest pain.  Current Medications: none Intolerances: atorvastatin, rosuvastatin 548mdaily (April 2021), ezetimibe 1070mx/week - myalgias Risk Factors: elevated calcium score, baseline LDL > 200 LDL goal: <70m15m  Diet: Salads, fruits, veggies, some meat  Exercise: 3 days a week + walking  Family History: Father with colon cancer.  Social History: Denies tobacco and drug use, drinks 3 glasses of wine per week.  Labs: 07/06/20: TC 258, HDL 62, TG 125,  LDL 174, nonHDL 196 (no LLT)  Past Medical History:  Diagnosis Date    Allergy    Cancer ([facility])[place]13/2011   Right lumpectomy+alnd,T1bN1a,ERPR+,Her2-   Gynecological examination    sees Melinda. MarkFreda MunroHyperlipidemia    pt denies   Melanoma ([facility])[place]/25/2019   IN SITU- RIGHT THIGH- TX EXCISION    Current Outpatient Medications on File Prior to Visit  Medication Sig Dispense Refill   amoxicillin-clavulanate (AUGMENTIN) 875-125 MG tablet Take 1 tablet by mouth 2 (two) times daily. (Patient not taking: Reported on 10/14/2020) 20 tablet 0   Apoaequorin (PREVAGEN) 10 MG CAPS Take 1 tablet by mouth daily.     aspirin 81 MG tablet Take 81 mg by mouth as needed for pain.  (Patient not taking: Reported on 10/14/2020)     b complex vitamins tablet Take 1 tablet by mouth daily. (Patient not taking: Reported on 10/14/2020)     benzonatate (TESSALON) 100 MG capsule Take 1 capsule (100 mg total) by mouth 3 (three) times daily. (Patient not taking: Reported on 10/14/2020) 20 capsule 0   Cholecalciferol (VITAMIN D PO) Take 2,000 Units by mouth daily.      fish oil-omega-3 fatty acids 1000 MG capsule Take 2 g by mouth daily.      HYDROcodone-homatropine (HYCODAN) 5-1.5 MG/5ML syrup Take 5 mLs by mouth every 8 (eight) hours as needed for cough. (Patient not taking: Reported  on 10/14/2020) 120 mL 0   LOTEMAX 0.5 % GEL Reported on 04/27/2015 (Patient not taking: Reported on 10/14/2020)  0   Multiple Vitamin (MULTIVITAMIN) tablet Take 1 tablet by mouth daily.     Multiple Vitamins-Minerals (OCUVITE PO) Take by mouth daily. (Patient not taking: Reported on 10/14/2020)     Multiple Vitamins-Minerals (PRESERVISION AREDS 2 PO) Take 1 tablet by mouth daily.     No current facility-administered medications on file prior to visit.    Allergies  Allergen Reactions   Atorvastatin     REACTION: muscle aches    Assessment/Plan:  1. Hyperlipidemia - Baseline LDL 176 above goal < 70 due to elevated calcium score. Pt is intolerant to 3 statins and ezetimibe. Discussed PCSK9i therapy today  which pt is agreeable to trying, however her insurance is very strict with Repatha coverage. They require a 10 year ASCVD risk > 20% (hers is 5% using MESA calculator since CAC is available), an LDL > 220, or CAC score > 300. She believes LDL has been > 220 in the past but has been unable to track down records, highest in our system is 201. Will submit prior authorization for Repatha.  Melinda Webster, PharmD, BCACP, [facility] [address] [place], [place] [address] Phone: [phone]; Fax: [phone] 10/29/2020 8:41 AM

## 2020-10-29 NOTE — Telephone Encounter (Signed)
This patient has never been seen at [facility].

## 2020-10-29 NOTE — Telephone Encounter (Signed)
Patient needs copy of cholesterol test taken when she was a patient here for new provider. Please advise at [phone].

## 2020-10-30 ENCOUNTER — Telehealth: Payer: Self-pay | Admitting: Pharmacist

## 2020-10-30 DIAGNOSIS — E782 Mixed hyperlipidemia: Secondary | ICD-10-CM

## 2020-10-30 MED ORDER — REPATHA SURECLICK 140 MG/ML ~~LOC~~ SOAJ: 1.0000 "pen " | SUBCUTANEOUS | 11 refills | Status: DC

## 2020-10-30 NOTE — Telephone Encounter (Signed)
Emailed copay card information BIN [id number], PCN [id number], [place] [id number], ID [id number]

## 2020-10-30 NOTE — Telephone Encounter (Signed)
Repatha PA approved through 10/29/21. Rx sent to pharmacy along with $5 copay card. Pt will come fasting to f/u appt with Dr Johnsie Cancel in November to have cholesterol rechecked. She was appreciative for the assistance.

## 2020-11-04 ENCOUNTER — Ambulatory Visit: Payer: 59 | Admitting: Dermatology

## 2020-11-04 DIAGNOSIS — Z23 Encounter for immunization: Secondary | ICD-10-CM | POA: Diagnosis not present

## 2020-11-05 ENCOUNTER — Encounter: Payer: Self-pay | Admitting: Gastroenterology

## 2020-11-13 NOTE — Telephone Encounter (Signed)
Patient stated she was a patient here when Dr. Concha Se was here and was able to obtain records elsewhere.

## 2020-12-23 ENCOUNTER — Encounter: Payer: Self-pay | Admitting: Gastroenterology

## 2020-12-23 ENCOUNTER — Other Ambulatory Visit: Payer: Self-pay

## 2020-12-23 ENCOUNTER — Ambulatory Visit (AMBULATORY_SURGERY_CENTER): Payer: BC Managed Care – PPO | Admitting: *Deleted

## 2020-12-23 VITALS — Ht 62.0 in | Wt 139.0 lb

## 2020-12-23 DIAGNOSIS — Z800 Family history of malignant neoplasm of digestive organs: Secondary | ICD-10-CM

## 2020-12-23 DIAGNOSIS — Z86010 Personal history of colonic polyps: Secondary | ICD-10-CM

## 2020-12-23 MED ORDER — NA SULFATE-K SULFATE-MG SULF 17.5-3.13-1.6 GM/177ML PO SOLN: 1.0000 | ORAL | 0 refills | Status: DC

## 2020-12-23 NOTE — Progress Notes (Signed)

## 2020-12-29 ENCOUNTER — Ambulatory Visit: Payer: BC Managed Care – PPO | Admitting: Cardiovascular Disease

## 2020-12-29 ENCOUNTER — Other Ambulatory Visit: Payer: BC Managed Care – PPO

## 2020-12-30 ENCOUNTER — Telehealth: Payer: Self-pay | Admitting: Pharmacist

## 2020-12-30 DIAGNOSIS — M9903 Segmental and somatic dysfunction of lumbar region: Secondary | ICD-10-CM | POA: Diagnosis not present

## 2020-12-30 DIAGNOSIS — M9902 Segmental and somatic dysfunction of thoracic region: Secondary | ICD-10-CM | POA: Diagnosis not present

## 2020-12-30 DIAGNOSIS — M9901 Segmental and somatic dysfunction of cervical region: Secondary | ICD-10-CM | POA: Diagnosis not present

## 2020-12-30 DIAGNOSIS — M9905 Segmental and somatic dysfunction of pelvic region: Secondary | ICD-10-CM | POA: Diagnosis not present

## 2020-12-30 NOTE — Telephone Encounter (Signed)
Patient was asking about lab work will send message to pharmacist.

## 2020-12-30 NOTE — Telephone Encounter (Signed)
Called pt and left message to r/s lab appt to check lipids after starting Repatha.

## 2021-01-01 DIAGNOSIS — M9901 Segmental and somatic dysfunction of cervical region: Secondary | ICD-10-CM | POA: Diagnosis not present

## 2021-01-01 DIAGNOSIS — M9902 Segmental and somatic dysfunction of thoracic region: Secondary | ICD-10-CM | POA: Diagnosis not present

## 2021-01-01 DIAGNOSIS — M9903 Segmental and somatic dysfunction of lumbar region: Secondary | ICD-10-CM | POA: Diagnosis not present

## 2021-01-01 DIAGNOSIS — M9905 Segmental and somatic dysfunction of pelvic region: Secondary | ICD-10-CM | POA: Diagnosis not present

## 2021-01-04 DIAGNOSIS — M9903 Segmental and somatic dysfunction of lumbar region: Secondary | ICD-10-CM | POA: Diagnosis not present

## 2021-01-04 DIAGNOSIS — M9901 Segmental and somatic dysfunction of cervical region: Secondary | ICD-10-CM | POA: Diagnosis not present

## 2021-01-04 DIAGNOSIS — M9902 Segmental and somatic dysfunction of thoracic region: Secondary | ICD-10-CM | POA: Diagnosis not present

## 2021-01-04 DIAGNOSIS — M9905 Segmental and somatic dysfunction of pelvic region: Secondary | ICD-10-CM | POA: Diagnosis not present

## 2021-01-04 NOTE — Progress Notes (Signed)
Office Visit    Patient Name: Melinda Webster Date of Encounter: 01/05/2021  PCP:  London Pepper, [facility]  Cardiologist:  Jenkins Rouge, MD  Advanced Practice Provider:  No care team member to display Electrophysiologist:  None  [id number]}  Chief Complaint    Melinda Webster is a 60 y.o. female with a hx of CAD, history of breast cancer, HLD, and insomnia presents today for follow-up.  She had a calcium score done 07/16/2020 with a total of 92.6 which is the 89th percentile for age and sex mostly in the RCA.  She does not have any clinical symptoms or history of vascular disease. She is here today to review her lab work after starting [place].   Past Medical History    Past Medical History:  Diagnosis Date   Allergy    Cancer ([place]) 06/03/2009   Right lumpectomy+alnd,T1bN1a,ERPR+,Her2-   Gynecological examination    sees Dr. Freda Webster    Hyperlipidemia    pt denies   Melanoma ([place]) 08/15/2017   IN SITU- RIGHT THIGH- [place]   Past Surgical History:  Procedure Laterality Date   BREAST ENHANCEMENT SURGERY  05/2010   reconstruction due to breast cancer   BREAST LUMPECTOMY Right 2011   BREAST SURGERY  06/03/2009   Right lumpectomy & lymph node removal   CESAREAN SECTION  1984, 1989   COLONOSCOPY  01/12/2010   per Dr. Deatra Ina, clear , repeat in 5 rys    COLONOSCOPY W/ POLYPECTOMY  04/27/2015   Dr.Danis   MELANOMA EXCISION     in office   POLYPECTOMY      Allergies  Allergies  Allergen Reactions   Atorvastatin     REACTION: muscle aches    History of Present Illness    Melinda Webster is a 60 y.o. female with a hx of CAD, history of breast cancer, HLD, and insomnia last seen 10/14/2020 by Dr. Jenkins Rouge. She had a calcium score done 07/16/2020 with a total of 92.6 which is the 89th percentile for age and sex mostly in the RCA.  She does not have any clinical symptoms or history of vascular disease.  Today, she feels well without  any issues. She is not having any chest pain or shortness of breath. She remains physically active and lifts weights with a group of friends. She is having some neck pain today which she thinks is associated with one of her recent workouts. She has a family history of hyperlipidemia and was recently seen in the lipid clinic and started on Repatha. She has not had a chance to get her lipid panel and LFTs done but is prepared today to do so. We will plan to give her a call once her labs result to see if there is a difference since starting the [place].   Reports no shortness of breath nor dyspnea on exertion. Reports no chest pain, pressure, or tightness. No edema, orthopnea, PND. Reports no palpitations.     EKGs/Labs/Other Studies Reviewed:   The following studies were reviewed today:  Echocardiogram performed on 01/01/2009  Study Conclusions   Left ventricle: The cavity size was normal. Wall thickness was   normal. The estimated ejection fraction was 60%. Wall motion was   normal; there were no regional wall motion abnormalities. Left   ventricular diastolic function parameters were normal.   Transthoracic echocardiography. M-mode, complete 2D, spectral   Doppler, and color Doppler. Height: Height: 160cm. Height: 63in.  Weight: Weight: 63.6kg. Weight: 140lb. Body mass index: BMI:   24.9kg/m^2. Body surface area: BSA: 1.50m2. Patient status:   Inpatient. Location: Echo laboratory.    --------------------------------------------------------------------   Left ventricle: The cavity size was normal. Wall thickness was   normal. The estimated ejection fraction was 60%. Wall motion was   normal; there were no regional wall motion abnormalities. The   transmitral flow pattern was normal. The deceleration time of the   early transmitral flow velocity was normal. The pulmonary vein flow   pattern was normal. The tissue Doppler parameters were normal. Left   ventricular diastolic function  parameters were normal.   Aortic valve: Structurally normal valve. Cusp separation was normal.   Doppler: Transvalvular velocity was within the normal range. There   was no stenosis. No regurgitation.   Aorta: The aorta was normal, not dilated, and non-diseased.   Mitral valve: Structurally normal valve. Leaflet separation was   normal. Doppler: Transvalvular velocity was within the normal range.   There was no evidence for stenosis. Trivial regurgitation.  Peak   gradient: 521mHg (D).   Left atrium: The atrium was normal in size.   Right ventricle: The cavity size was normal. Wall thickness was   normal. Systolic function was normal.   Pulmonic valve: Structurally normal valve. Cusp separation was   normal. Doppler: Transvalvular velocity was within the normal range.   No regurgitation.   Tricuspid valve: Structurally normal valve. Leaflet separation was   normal. Doppler: Transvalvular velocity was within the normal range.   No regurgitation.   Right atrium: The atrium was normal in size.   Pericardium: There was no pericardial effusion.   Post procedure conclusions   Ascending Aorta:    - The aorta was normal, not dilated, and non-diseased.    --------------------------------------------------------------------    2D measurements            Normal  Doppler measurements      Normal   Left ventricle                     Left ventricle   LVID ED,         44 mm     43-52   Ea, lat ann,   12.1 cm/s  -------   chord, PLAX                        tiss DP   LVID ES,         31 mm     23-38   E/Ea, lat      9.26       -------   chord, PLAX                        ann, tiss DP   FS, chord,       30 %      >29     Ea, med ann,   9.46 cm/s  -------   PLAX                               tiss DP   LVPW, ED          9 mm     ------  E/Ea, med     11.84       -------   IVS/LVPW       0.89        <  1.3    ann, tiss DP   ratio, ED                          Mitral valve   Vol ED, MOD1     51 ml      ------  Peak E vel      112 cm/s  -------   Vol ES, MOD1     11 ml     ------  Peak A vel     65.2 cm/s  -------   EF, MOD1         78 %      ------  Deceleration    173 ms    150-230   Vol index, ED,   30 ml/m^2 ------  time   MOD1                               Peak              5 mm Hg -------   Vol index, ES,    6 ml/m^2 ------  gradient, D   MOD1                               Peak E/A        1.7       -------   Ventricular septum                 ratio   IVS, ED           8 mm     ------   Aorta   Root diam, ED    22 mm     ------   Left atrium   AP dim           32 mm     ------   AP dim index   1.89 cm/m^2 <2.2    Prepared and Electronically Authenticated by    Peter Martinique, MD   2010-11-11T16:39:23.483   EKG:  No EKG today.   Recent Labs: No results found for requested labs within last 8760 hours.  Recent Lipid Panel    Component Value Date/Time   CHOL 193 05/10/2011 0852   TRIG 129.0 05/10/2011 0852   HDL 59.40 05/10/2011 0852   CHOLHDL 3 05/10/2011 0852   VLDL 25.8 05/10/2011 0852   LDLCALC 108 (H) 05/10/2011 0852   LDLDIRECT 134.4 10/08/2009 1033    Home Medications   Current Meds  Medication Sig   Apoaequorin (PREVAGEN) 10 MG CAPS Take 1 tablet by mouth daily.   Cholecalciferol (VITAMIN D PO) Take 2,000 Units by mouth daily.    Evolocumab (REPATHA SURECLICK) 976 MG/ML SOAJ Inject 1 pen into the skin every 14 (fourteen) days.   fish oil-omega-3 fatty acids 1000 MG capsule Take 2 g by mouth daily.    Multiple Vitamin (MULTIVITAMIN) tablet Take 1 tablet by mouth daily.   Multiple Vitamins-Minerals (PRESERVISION AREDS 2+MULTI VIT PO) Take 1 tablet by mouth daily.   Na Sulfate-K Sulfate-Mg Sulf 17.5-3.13-1.6 GM/177ML SOLN Take 1 kit by mouth as directed. May use generic Suprep     Review of Systems    All other systems reviewed and are otherwise negative except as noted above.  Physical Exam    VS:  BP 126/80 (BP Location: Right Arm, Patient Position:  Sitting, Cuff  Size: Normal)   Pulse 70   Ht 5' 2"  (1.575 m)   Wt 140 lb (63.5 kg)   BMI 25.61 kg/m  , BMI Body mass index is 25.61 kg/m.  Wt Readings from Last 3 Encounters:  01/05/21 140 lb (63.5 kg)  12/23/20 139 lb (63 kg)  10/14/20 139 lb (63 kg)     GEN: Well nourished, well developed, in no acute distress. HEENT: normal. Cardiac: RRR, no murmurs, rubs, or gallops. No clubbing, cyanosis, edema.   Respiratory:  Respirations regular and unlabored, clear to auscultation bilaterally. GI: Soft, nontender, nondistended. MS: No deformity or atrophy. Skin: Warm and dry, no rash. Neuro:  Strength and sensation are intact. Psych: Normal affect.  Assessment & Plan    CAD-high calcium score for her age.  No clinical symptoms of chest pain or shortness of breath.  She was started on Repatha a few months ago due to a statin allergy.  Continue daily aspirin 81 mg.  Preserved left ventricular ejection fraction on echocardiogram from November 2010.  No significant valvular dysfunction.   Hyperlipidemia-labs not done at this time. She will go today for a Lipid panel and LFTs.  She is fasting today since she has a coloscopy tomorrow. She was seen in the lipid clinic on 10/29/2020. Continue Repatha. Last LDL 108, total cholesterol 193.  Disposition: Follow up in 6 month(s) with Jenkins Rouge, MD or APP.  Signed, Elgie Collard, PA-C 01/05/2021, 2:09 PM [place]

## 2021-01-05 ENCOUNTER — Other Ambulatory Visit: Payer: BC Managed Care – PPO

## 2021-01-05 ENCOUNTER — Encounter (HOSPITAL_BASED_OUTPATIENT_CLINIC_OR_DEPARTMENT_OTHER): Payer: Self-pay | Admitting: Physician Assistant

## 2021-01-05 ENCOUNTER — Other Ambulatory Visit: Payer: Self-pay

## 2021-01-05 ENCOUNTER — Ambulatory Visit (HOSPITAL_BASED_OUTPATIENT_CLINIC_OR_DEPARTMENT_OTHER): Payer: BC Managed Care – PPO | Admitting: Physician Assistant

## 2021-01-05 VITALS — BP 126/80 | HR 70 | Ht 62.0 in | Wt 140.0 lb

## 2021-01-05 DIAGNOSIS — E7849 Other hyperlipidemia: Secondary | ICD-10-CM

## 2021-01-05 DIAGNOSIS — I2510 Atherosclerotic heart disease of native coronary artery without angina pectoris: Secondary | ICD-10-CM

## 2021-01-05 DIAGNOSIS — E782 Mixed hyperlipidemia: Secondary | ICD-10-CM | POA: Diagnosis not present

## 2021-01-05 NOTE — Patient Instructions (Signed)
Medication Instructions:  Your Physician recommend you continue on your current medication as directed.    *If you need a refill on your cardiac medications before your next appointment, please call your pharmacy*   Lab Work: Please report to the 3rd floor today for labs   If you have labs (blood work) drawn today and your tests are completely normal, you will receive your results only by: [place] (if you have MyChart) OR A paper copy in the mail If you have any lab test that is abnormal or we need to change your treatment, we will call you to review the results.   Testing/Procedures: None ordered today    Follow-Up: At [facility], you and your health needs are our priority.  As part of our continuing mission to provide you with exceptional heart care, we have created designated Provider Care Teams.  These Care Teams include your primary Cardiologist (physician) and Advanced Practice Providers (APPs -  Physician Assistants and Nurse Practitioners) who all work together to provide you with the care you need, when you need it.  We recommend signing up for the patient portal called "MyChart".  Sign up information is provided on this After Visit Summary.  MyChart is used to connect with patients for Virtual Visits (Telemedicine).  Patients are able to view lab/test results, encounter notes, upcoming appointments, etc.  Non-urgent messages can be sent to your provider as well.   To learn more about what you can do with MyChart, go to [email or web address].    Your next appointment:   6 month(s)  The format for your next appointment:   In Person  Provider:   Jenkins Rouge, MD  or  APPs         Other Instructions None

## 2021-01-06 ENCOUNTER — Encounter: Payer: Self-pay | Admitting: Gastroenterology

## 2021-01-06 ENCOUNTER — Ambulatory Visit (AMBULATORY_SURGERY_CENTER): Payer: BC Managed Care – PPO | Admitting: Gastroenterology

## 2021-01-06 VITALS — BP 113/56 | HR 71 | Temp 97.5°F | Resp 14 | Ht 62.0 in | Wt 139.0 lb

## 2021-01-06 DIAGNOSIS — Z800 Family history of malignant neoplasm of digestive organs: Secondary | ICD-10-CM | POA: Diagnosis not present

## 2021-01-06 DIAGNOSIS — Z1211 Encounter for screening for malignant neoplasm of colon: Secondary | ICD-10-CM | POA: Diagnosis not present

## 2021-01-06 DIAGNOSIS — Z86010 Personal history of colonic polyps: Secondary | ICD-10-CM | POA: Diagnosis not present

## 2021-01-06 LAB — LIPID PANEL
Chol/HDL Ratio: 2.4 ratio (ref 0.0–4.4)
Cholesterol, Total: 153 mg/dL (ref 100–199)
HDL: 65 mg/dL (ref >39)
LDL Chol Calc (NIH): 69 mg/dL (ref 0–99)
Triglycerides: 109 mg/dL (ref 0–149)
VLDL Cholesterol Cal: 19 mg/dL (ref 5–40)

## 2021-01-06 LAB — HEPATIC FUNCTION PANEL
ALT: 16 IU/L (ref 0–32)
AST: 23 IU/L (ref 0–40)
Albumin: 4.8 g/dL (ref 3.8–4.8)
Alkaline Phosphatase: 109 IU/L (ref 44–121)
Bilirubin Total: 0.4 mg/dL (ref 0.0–1.2)
Bilirubin, Direct: 0.14 mg/dL (ref 0.00–0.40)
Total Protein: 7.5 g/dL (ref 6.0–8.5)

## 2021-01-06 MED ORDER — SODIUM CHLORIDE 0.9 % IV SOLN: 500.0000 mL | INTRAVENOUS | Status: AC

## 2021-01-06 NOTE — Op Note (Signed)
[place] Patient Name: Melinda Webster Procedure Date: 01/06/2021 8:57 AM MRN: [id number] Endoscopist: [place]. Loletha Carrow , MD Age: 60 Referring MD:  Date of Birth: 02-Jun-1960 Gender: Female Account #: [id number] Procedure:                Colonoscopy Indications:              Surveillance: Personal history of adenomatous                            polyps on last colonoscopy 5 years ago                           TA < 84mm in 04/2015 (exam done for Fam Hx CRC in                            father) Medicines:                Monitored Anesthesia Care Procedure:                Pre-Anesthesia Assessment:                           - Prior to the procedure, a History and Physical                            was performed, and patient medications and                            allergies were reviewed. The patient's tolerance of                            previous anesthesia was also reviewed. The risks                            and benefits of the procedure and the sedation                            options and risks were discussed with the patient.                            All questions were answered, and informed consent                            was obtained. Prior Anticoagulants: The patient has                            taken no previous anticoagulant or antiplatelet                            agents. ASA Grade Assessment: II - A patient with                            mild systemic disease. After reviewing the risks  and benefits, the patient was deemed in                            satisfactory condition to undergo the procedure.                           After obtaining informed consent, the colonoscope                            was passed under direct vision. Throughout the                            procedure, the patient's blood pressure, pulse, and                            oxygen saturations were monitored continuously. The                             CF [id number] #[id number] was introduced through the anus                            and advanced to the the terminal ileum, with                            identification of the appendiceal orifice and IC                            valve. The colonoscopy was performed without                            difficulty. The patient tolerated the procedure                            well. The quality of the bowel preparation was                            excellent. The terminal ileum, ileocecal valve,                            appendiceal orifice, and rectum were photographed.                            The bowel preparation used was SUPREP. Scope In: 9:07:18 AM Scope Out: 9:17:02 AM Scope Withdrawal Time: 0 hours 7 minutes 11 seconds  Total Procedure Duration: 0 hours 9 minutes 44 seconds  Findings:                 The perianal and digital rectal examinations were                            normal.                           The terminal ileum appeared normal.  The entire examined colon appeared normal on direct                            and retroflexion views. Complications:            No immediate complications. Estimated Blood Loss:     Estimated blood loss: none. Impression:               - The examined portion of the ileum was normal.                           - The entire examined colon is normal on direct and                            retroflexion views.                           - No specimens collected. Recommendation:           - Patient has a contact number available for                            emergencies. The signs and symptoms of potential                            delayed complications were discussed with the                            patient. Return to normal activities tomorrow.                            Written discharge instructions were provided to the                            patient.                           - Resume previous diet.                            - Continue present medications.                           - Repeat colonoscopy in 5 years for screening                            purposes (Fam Hx CRC). [name] L. Loletha Carrow, MD 01/06/2021 9:23:54 AM This report has been signed electronically.

## 2021-01-06 NOTE — Progress Notes (Signed)
Pt Drowsy. VSS. To PACU, report to RN. No anesthetic complications noted.  

## 2021-01-06 NOTE — Progress Notes (Signed)
History and Physical:  This patient presents for endoscopic testing for: Encounter Diagnosis  Name Primary?   Personal history of colonic polyps Yes    3/17 < 42m TA (Fam Hx CRC father)  ROS: Patient denies chest pain or cough   Past Medical History: Past Medical History:  Diagnosis Date   Allergy    [place]   Right lumpectomy+alnd,T1bN1a,ERPR+,Her2-   Gynecological examination    sees Dr. MFreda Munro   Hyperlipidemia    pt denies   [place]   [place]    Past Surgical History: Past Surgical History:  Procedure Laterality Date   BREAST ENHANCEMENT SURGERY  05/2010   reconstruction due to breast cancer   BREAST LUMPECTOMY Right 2011   BREAST SURGERY  06/03/2009   Right lumpectomy & lymph node removal   CESAREAN SECTION  1984, 1989   COLONOSCOPY  01/12/2010   per Dr. KDeatra Ina clear , repeat in 5 rys    COLONOSCOPY W/ POLYPECTOMY  04/27/2015   Dr.Danis   MELANOMA EXCISION     in office   POLYPECTOMY      Allergies: Allergies  Allergen Reactions   Atorvastatin     REACTION: muscle aches    Outpatient Meds: Current Outpatient Medications  Medication Sig Dispense Refill   Apoaequorin (PREVAGEN) 10 MG CAPS Take 1 tablet by mouth daily.     Cholecalciferol (VITAMIN D PO) Take 2,000 Units by mouth daily.      fish oil-omega-3 fatty acids 1000 MG capsule Take 2 g by mouth daily.      Multiple Vitamin (MULTIVITAMIN) tablet Take 1 tablet by mouth daily.     Multiple Vitamins-Minerals (PRESERVISION AREDS 2+MULTI VIT PO) Take 1 tablet by mouth daily.     Evolocumab (REPATHA SURECLICK) 1592MG/ML SOAJ Inject 1 pen into the skin every 14 (fourteen) days. 2 mL 11   Current Facility-Administered Medications  Medication Dose Route Frequency Provider Last Rate Last Admin   0.9 %  sodium chloride infusion  500 mL Intravenous Continuous Danis, HEstill CottaIII, MD           ___________________________________________________________________ Objective   Exam:  BP 122/80   Pulse 83   Temp (!) 97.5 F (36.4 C)   Ht 5' 2"  (1.575 m)   Wt 139 lb (63 kg)   SpO2 100%   BMI 25.42 kg/m   CV: RRR without murmur, S1/S2 Resp: clear to auscultation bilaterally, normal RR and effort noted GI: soft, no tenderness, with active bowel sounds.   Assessment: Encounter Diagnosis  Name Primary?   Personal history of colonic polyps Yes     Plan: Colonoscopy  The benefits and risks of the planned procedure were described in detail with the patient or (when appropriate) their health care proxy.  Risks were outlined as including, but not limited to, bleeding, infection, perforation, adverse medication reaction leading to cardiac or pulmonary decompensation, pancreatitis (if ERCP).  The limitation of incomplete mucosal visualization was also discussed.  No guarantees or warranties were given.    The patient is appropriate for an endoscopic procedure in the ambulatory setting.   - HWilfrid Lund MD

## 2021-01-06 NOTE — Progress Notes (Signed)
VS by CW; previsit over the phone

## 2021-01-06 NOTE — Patient Instructions (Signed)
YOU HAD AN ENDOSCOPIC PROCEDURE TODAY AT [facility]:   Refer to the procedure report that was given to you for any specific questions about what was found during the examination.  If the procedure report does not answer your questions, please call your gastroenterologist to clarify.  If you requested that your care partner not be given the details of your procedure findings, then the procedure report has been included in a sealed envelope for you to review at your convenience later.  YOU SHOULD EXPECT: Some feelings of bloating in the abdomen. Passage of more gas than usual.  Walking can help get rid of the air that was put into your GI tract during the procedure and reduce the bloating. If you had a lower endoscopy (such as a colonoscopy or flexible sigmoidoscopy) you may notice spotting of blood in your stool or on the toilet paper. If you underwent a bowel prep for your procedure, you may not have a normal bowel movement for a few days.  Please Note:  You might notice some irritation and congestion in your nose or some drainage.  This is from the oxygen used during your procedure.  There is no need for concern and it should clear up in a day or so.  SYMPTOMS TO REPORT IMMEDIATELY:  Following lower endoscopy (colonoscopy or flexible sigmoidoscopy):  Excessive amounts of blood in the stool  Significant tenderness or worsening of abdominal pains  Swelling of the abdomen that is new, acute  Fever of 100F or higher   For urgent or emergent issues, a gastroenterologist can be reached at any hour by calling [phone]. Do not use MyChart messaging for urgent concerns.    DIET:  We do recommend a small meal at first, but then you may proceed to your regular diet.  Drink plenty of fluids but you should avoid alcoholic beverages for 24 hours.  MEDICATIONS: Continue present medications.  Thank you for allowing [place] to provide for your health care needs today.  ACTIVITY:  You  should plan to take it easy for the rest of today and you should NOT DRIVE or use heavy machinery until tomorrow (because of the sedation medicines used during the test).    FOLLOW UP: Our staff will call the number listed on your records 48-72 hours following your procedure to check on you and address any questions or concerns that you may have regarding the information given to you following your procedure. If we do not reach you, we will leave a message.  We will attempt to reach you two times.  During this call, we will ask if you have developed any symptoms of COVID 19. If you develop any symptoms (ie: fever, flu-like symptoms, shortness of breath, cough etc.) before then, please call [phone].  If you test positive for Covid 19 in the 2 weeks post procedure, please call and report this information to [place].    If any biopsies were taken you will be contacted by phone or by letter within the next 1-3 weeks.  Please call us at [phone] if you have not heard about the biopsies in 3 weeks.    SIGNATURES/CONFIDENTIALITY: You and/or your care partner have signed paperwork which will be entered into your electronic medical record.  These signatures attest to the fact that that the information above on your After Visit Summary has been reviewed and is understood.  Full responsibility of the confidentiality of this discharge information lies with you and/or your care-partner.

## 2021-01-08 ENCOUNTER — Telehealth: Payer: Self-pay | Admitting: *Deleted

## 2021-01-08 DIAGNOSIS — M9903 Segmental and somatic dysfunction of lumbar region: Secondary | ICD-10-CM | POA: Diagnosis not present

## 2021-01-08 DIAGNOSIS — M9901 Segmental and somatic dysfunction of cervical region: Secondary | ICD-10-CM | POA: Diagnosis not present

## 2021-01-08 DIAGNOSIS — M9905 Segmental and somatic dysfunction of pelvic region: Secondary | ICD-10-CM | POA: Diagnosis not present

## 2021-01-08 DIAGNOSIS — M9902 Segmental and somatic dysfunction of thoracic region: Secondary | ICD-10-CM | POA: Diagnosis not present

## 2021-01-08 NOTE — Telephone Encounter (Signed)
  Follow up Call-  Call back number 01/06/2021  Post procedure Call Back phone  # [phone]  Permission to leave phone message Yes  Some recent data might be hidden     Patient questions:  Do you have a fever, pain , or abdominal swelling? No. Pain Score  0 *  Have you tolerated food without any problems? Yes.    Have you been able to return to your normal activities? Yes.    Do you have any questions about your discharge instructions: Diet   No. Medications  No. Follow up visit  No.  Do you have questions or concerns about your Care? No.  Actions: * If pain score is 4 or above: No action needed, pain <4.  Have you developed a fever since your procedure? no  2.   Have you had an respiratory symptoms (SOB or cough) since your procedure? no  3.   Have you tested positive for COVID 19 since your procedure no  4.   Have you had any family members/close contacts diagnosed with the COVID 19 since your procedure?  no   If yes to any of these questions please route to Joylene John, RN and Joella Prince, RN

## 2021-01-12 ENCOUNTER — Other Ambulatory Visit: Payer: BC Managed Care – PPO

## 2021-02-10 ENCOUNTER — Encounter: Payer: Self-pay | Admitting: Gastroenterology

## 2021-02-19 ENCOUNTER — Telehealth: Payer: Self-pay | Admitting: Gastroenterology

## 2021-02-19 NOTE — Telephone Encounter (Signed)
Patient returned my call regarding questions she had about her procedure code.  Advised pt that the procedure cannot be preventative because she has a hx of colon polyps so it will always be coded diagnostic.   Pt was really upset because she states that she did not receive quote on this. Advised pt that we did send her a letter to which she replied that she did get it and I told her that in that letter its a quote that her procedure would go towards deductible and ins would pick up 70% so she'll be responsible for 30%. And on the bottom of that letter it does state that if insurance considers this colon high risk charges may change. She was still very upset about it because we did not tell her that it was high risk. I tried explaining that her colon was a diagnostic colon  not preventative. Pt states that she will call [facility] billing and Dr Loletha Carrow.

## 2021-02-23 ENCOUNTER — Encounter: Payer: Self-pay | Admitting: Gastroenterology

## 2021-03-11 DIAGNOSIS — M25511 Pain in right shoulder: Secondary | ICD-10-CM | POA: Diagnosis not present

## 2021-03-11 DIAGNOSIS — M67813 Other specified disorders of tendon, right shoulder: Secondary | ICD-10-CM | POA: Diagnosis not present

## 2021-03-11 DIAGNOSIS — M542 Cervicalgia: Secondary | ICD-10-CM | POA: Diagnosis not present

## 2021-03-11 DIAGNOSIS — M25512 Pain in left shoulder: Secondary | ICD-10-CM | POA: Diagnosis not present

## 2021-03-12 DIAGNOSIS — M9902 Segmental and somatic dysfunction of thoracic region: Secondary | ICD-10-CM | POA: Diagnosis not present

## 2021-03-12 DIAGNOSIS — M9901 Segmental and somatic dysfunction of cervical region: Secondary | ICD-10-CM | POA: Diagnosis not present

## 2021-03-12 DIAGNOSIS — M9903 Segmental and somatic dysfunction of lumbar region: Secondary | ICD-10-CM | POA: Diagnosis not present

## 2021-03-12 DIAGNOSIS — M9905 Segmental and somatic dysfunction of pelvic region: Secondary | ICD-10-CM | POA: Diagnosis not present

## 2021-03-16 DIAGNOSIS — Z01419 Encounter for gynecological examination (general) (routine) without abnormal findings: Secondary | ICD-10-CM | POA: Diagnosis not present

## 2021-03-18 DIAGNOSIS — M9901 Segmental and somatic dysfunction of cervical region: Secondary | ICD-10-CM | POA: Diagnosis not present

## 2021-03-18 DIAGNOSIS — M9905 Segmental and somatic dysfunction of pelvic region: Secondary | ICD-10-CM | POA: Diagnosis not present

## 2021-03-18 DIAGNOSIS — M9903 Segmental and somatic dysfunction of lumbar region: Secondary | ICD-10-CM | POA: Diagnosis not present

## 2021-03-18 DIAGNOSIS — M9902 Segmental and somatic dysfunction of thoracic region: Secondary | ICD-10-CM | POA: Diagnosis not present

## 2021-03-21 DIAGNOSIS — M25511 Pain in right shoulder: Secondary | ICD-10-CM | POA: Diagnosis not present

## 2021-05-04 ENCOUNTER — Encounter: Payer: Self-pay | Admitting: Cardiovascular Disease

## 2021-05-19 DIAGNOSIS — M75121 Complete rotator cuff tear or rupture of right shoulder, not specified as traumatic: Secondary | ICD-10-CM | POA: Diagnosis not present

## 2021-05-19 DIAGNOSIS — M19011 Primary osteoarthritis, right shoulder: Secondary | ICD-10-CM | POA: Diagnosis not present

## 2021-05-19 DIAGNOSIS — M7521 Bicipital tendinitis, right shoulder: Secondary | ICD-10-CM | POA: Diagnosis not present

## 2021-07-15 ENCOUNTER — Other Ambulatory Visit: Payer: Self-pay | Admitting: Family Medicine

## 2021-07-15 ENCOUNTER — Other Ambulatory Visit: Payer: Self-pay | Admitting: Obstetrics and Gynecology

## 2021-07-15 DIAGNOSIS — Z1231 Encounter for screening mammogram for malignant neoplasm of breast: Secondary | ICD-10-CM

## 2021-08-05 ENCOUNTER — Ambulatory Visit
Admission: RE | Admit: 2021-08-05 | Discharge: 2021-08-05 | Disposition: A | Discharge status: Home / Self Care | Payer: BC Managed Care – PPO | Source: Ambulatory Visit | Attending: Obstetrics and Gynecology | Admitting: Obstetrics and Gynecology

## 2021-08-05 DIAGNOSIS — Z1231 Encounter for screening mammogram for malignant neoplasm of breast: Secondary | ICD-10-CM

## 2021-08-05 HISTORY — DX: Personal history of irradiation: Z92.3

## 2021-08-05 HISTORY — DX: Personal history of antineoplastic chemotherapy: Z92.21

## 2021-08-30 DIAGNOSIS — M75121 Complete rotator cuff tear or rupture of right shoulder, not specified as traumatic: Secondary | ICD-10-CM | POA: Diagnosis not present

## 2021-08-30 DIAGNOSIS — M7521 Bicipital tendinitis, right shoulder: Secondary | ICD-10-CM | POA: Diagnosis not present

## 2021-08-30 DIAGNOSIS — M19019 Primary osteoarthritis, unspecified shoulder: Secondary | ICD-10-CM | POA: Diagnosis not present

## 2021-09-02 DIAGNOSIS — S46111A Strain of muscle, fascia and tendon of long head of biceps, right arm, initial encounter: Secondary | ICD-10-CM | POA: Diagnosis not present

## 2021-09-02 DIAGNOSIS — S46011A Strain of muscle(s) and tendon(s) of the rotator cuff of right shoulder, initial encounter: Secondary | ICD-10-CM | POA: Diagnosis not present

## 2021-09-02 DIAGNOSIS — X58XXXA Exposure to other specified factors, initial encounter: Secondary | ICD-10-CM | POA: Diagnosis not present

## 2021-09-02 DIAGNOSIS — M19011 Primary osteoarthritis, right shoulder: Secondary | ICD-10-CM | POA: Diagnosis not present

## 2021-09-02 DIAGNOSIS — Y999 Unspecified external cause status: Secondary | ICD-10-CM | POA: Diagnosis not present

## 2021-09-02 DIAGNOSIS — M7541 Impingement syndrome of right shoulder: Secondary | ICD-10-CM | POA: Diagnosis not present

## 2021-09-02 DIAGNOSIS — S43431A Superior glenoid labrum lesion of right shoulder, initial encounter: Secondary | ICD-10-CM | POA: Diagnosis not present

## 2021-09-02 DIAGNOSIS — M94211 Chondromalacia, right shoulder: Secondary | ICD-10-CM | POA: Diagnosis not present

## 2021-09-02 DIAGNOSIS — G8918 Other acute postprocedural pain: Secondary | ICD-10-CM | POA: Diagnosis not present

## 2021-09-15 DIAGNOSIS — Z4789 Encounter for other orthopedic aftercare: Secondary | ICD-10-CM | POA: Diagnosis not present

## 2021-09-21 DIAGNOSIS — M7542 Impingement syndrome of left shoulder: Secondary | ICD-10-CM | POA: Diagnosis not present

## 2021-10-01 ENCOUNTER — Other Ambulatory Visit: Payer: Self-pay | Admitting: Cardiovascular Disease

## 2021-10-20 DIAGNOSIS — M75121 Complete rotator cuff tear or rupture of right shoulder, not specified as traumatic: Secondary | ICD-10-CM | POA: Diagnosis not present

## 2021-10-20 DIAGNOSIS — M25511 Pain in right shoulder: Secondary | ICD-10-CM | POA: Diagnosis not present

## 2021-10-28 DIAGNOSIS — M75121 Complete rotator cuff tear or rupture of right shoulder, not specified as traumatic: Secondary | ICD-10-CM | POA: Diagnosis not present

## 2021-10-28 DIAGNOSIS — M25511 Pain in right shoulder: Secondary | ICD-10-CM | POA: Diagnosis not present

## 2021-11-03 ENCOUNTER — Telehealth: Payer: Self-pay | Admitting: Pharmacist

## 2021-11-03 NOTE — Telephone Encounter (Signed)
Renewal PA for Repatha submitted Key: [id number]

## 2021-11-04 DIAGNOSIS — M75121 Complete rotator cuff tear or rupture of right shoulder, not specified as traumatic: Secondary | ICD-10-CM | POA: Diagnosis not present

## 2021-11-04 DIAGNOSIS — M25511 Pain in right shoulder: Secondary | ICD-10-CM | POA: Diagnosis not present

## 2021-11-12 DIAGNOSIS — M75121 Complete rotator cuff tear or rupture of right shoulder, not specified as traumatic: Secondary | ICD-10-CM | POA: Diagnosis not present

## 2021-11-12 DIAGNOSIS — M25511 Pain in right shoulder: Secondary | ICD-10-CM | POA: Diagnosis not present

## 2021-11-18 DIAGNOSIS — M75121 Complete rotator cuff tear or rupture of right shoulder, not specified as traumatic: Secondary | ICD-10-CM | POA: Diagnosis not present

## 2021-11-18 DIAGNOSIS — M25511 Pain in right shoulder: Secondary | ICD-10-CM | POA: Diagnosis not present

## 2021-12-06 DIAGNOSIS — J029 Acute pharyngitis, unspecified: Secondary | ICD-10-CM | POA: Diagnosis not present

## 2022-02-08 DIAGNOSIS — L821 Other seborrheic keratosis: Secondary | ICD-10-CM | POA: Diagnosis not present

## 2022-02-08 DIAGNOSIS — L814 Other melanin hyperpigmentation: Secondary | ICD-10-CM | POA: Diagnosis not present

## 2022-02-08 DIAGNOSIS — Z86006 Personal history of melanoma in-situ: Secondary | ICD-10-CM | POA: Diagnosis not present

## 2022-02-08 DIAGNOSIS — L578 Other skin changes due to chronic exposure to nonionizing radiation: Secondary | ICD-10-CM | POA: Diagnosis not present

## 2022-03-01 NOTE — Telephone Encounter (Signed)
Approved through 11/02/22.

## 2022-04-26 DIAGNOSIS — Z124 Encounter for screening for malignant neoplasm of cervix: Secondary | ICD-10-CM | POA: Diagnosis not present

## 2022-04-26 DIAGNOSIS — Z6826 Body mass index (BMI) 26.0-26.9, adult: Secondary | ICD-10-CM | POA: Diagnosis not present

## 2022-04-26 DIAGNOSIS — Z01419 Encounter for gynecological examination (general) (routine) without abnormal findings: Secondary | ICD-10-CM | POA: Diagnosis not present

## 2022-08-26 DIAGNOSIS — M25532 Pain in left wrist: Secondary | ICD-10-CM | POA: Diagnosis not present

## 2022-08-30 ENCOUNTER — Other Ambulatory Visit: Payer: Self-pay | Admitting: Family Medicine

## 2022-08-30 DIAGNOSIS — Z1231 Encounter for screening mammogram for malignant neoplasm of breast: Secondary | ICD-10-CM

## 2022-09-09 ENCOUNTER — Other Ambulatory Visit: Payer: Self-pay | Admitting: Family Medicine

## 2022-09-09 ENCOUNTER — Ambulatory Visit
Admission: RE | Admit: 2022-09-09 | Discharge: 2022-09-09 | Disposition: A | Discharge status: Home / Self Care | Payer: BC Managed Care – PPO | Source: Ambulatory Visit | Attending: Family Medicine | Admitting: Family Medicine

## 2022-09-09 DIAGNOSIS — Z1231 Encounter for screening mammogram for malignant neoplasm of breast: Secondary | ICD-10-CM

## 2022-09-09 DIAGNOSIS — M25532 Pain in left wrist: Secondary | ICD-10-CM | POA: Diagnosis not present

## 2022-10-15 ENCOUNTER — Other Ambulatory Visit: Payer: Self-pay | Admitting: Cardiovascular Disease

## 2022-10-17 ENCOUNTER — Encounter: Payer: Self-pay | Admitting: Cardiovascular Disease

## 2022-10-25 ENCOUNTER — Telehealth: Payer: Self-pay

## 2022-10-25 ENCOUNTER — Other Ambulatory Visit (HOSPITAL_COMMUNITY): Payer: Self-pay

## 2022-10-25 NOTE — Telephone Encounter (Signed)
Pharmacy Patient Advocate Encounter   Received notification from CoverMyMeds that prior authorization for REPATHA is required/requested.   Insurance verification completed.   The patient is insured through [facility] .   Per test claim: PA required; PA submitted to [facility] via CoverMyMeds Key/confirmation #/EOC [id number] Status is pending

## 2022-10-27 ENCOUNTER — Other Ambulatory Visit (HOSPITAL_COMMUNITY): Payer: Self-pay

## 2022-10-27 NOTE — Telephone Encounter (Signed)
Pharmacy Patient Advocate Encounter  Received notification from [facility] that Prior Authorization for REPATHA has been APPROVED from 9.3.24 to 9.3.25. Ran test claim, and the Rx is too soon to be filled and is payable again on/after 12/19/22 .   This test claim was processed through [facility]- copay amounts may vary at other pharmacies due to pharmacy/plan contracts, or as the patient moves through the different stages of their insurance plan.

## 2022-12-06 DIAGNOSIS — Z86006 Personal history of melanoma in-situ: Secondary | ICD-10-CM | POA: Diagnosis not present

## 2022-12-06 DIAGNOSIS — L814 Other melanin hyperpigmentation: Secondary | ICD-10-CM | POA: Diagnosis not present

## 2022-12-06 DIAGNOSIS — B078 Other viral warts: Secondary | ICD-10-CM | POA: Diagnosis not present

## 2022-12-06 DIAGNOSIS — L570 Actinic keratosis: Secondary | ICD-10-CM | POA: Diagnosis not present

## 2022-12-06 DIAGNOSIS — L578 Other skin changes due to chronic exposure to nonionizing radiation: Secondary | ICD-10-CM | POA: Diagnosis not present

## 2022-12-06 DIAGNOSIS — L821 Other seborrheic keratosis: Secondary | ICD-10-CM | POA: Diagnosis not present

## 2022-12-22 DIAGNOSIS — Z0000 Encounter for general adult medical examination without abnormal findings: Secondary | ICD-10-CM | POA: Diagnosis not present

## 2022-12-22 DIAGNOSIS — R7309 Other abnormal glucose: Secondary | ICD-10-CM | POA: Diagnosis not present

## 2022-12-23 DIAGNOSIS — Z23 Encounter for immunization: Secondary | ICD-10-CM | POA: Diagnosis not present

## 2022-12-23 DIAGNOSIS — Z0000 Encounter for general adult medical examination without abnormal findings: Secondary | ICD-10-CM | POA: Diagnosis not present

## 2023-03-14 DIAGNOSIS — L814 Other melanin hyperpigmentation: Secondary | ICD-10-CM | POA: Diagnosis not present

## 2023-03-14 DIAGNOSIS — Z86006 Personal history of melanoma in-situ: Secondary | ICD-10-CM | POA: Diagnosis not present

## 2023-03-14 DIAGNOSIS — L578 Other skin changes due to chronic exposure to nonionizing radiation: Secondary | ICD-10-CM | POA: Diagnosis not present

## 2023-03-14 DIAGNOSIS — L821 Other seborrheic keratosis: Secondary | ICD-10-CM | POA: Diagnosis not present

## 2023-04-18 DIAGNOSIS — D101 Benign neoplasm of tongue: Secondary | ICD-10-CM | POA: Diagnosis not present

## 2023-05-17 DIAGNOSIS — T466X5A Adverse effect of antihyperlipidemic and antiarteriosclerotic drugs, initial encounter: Secondary | ICD-10-CM | POA: Diagnosis not present

## 2023-05-17 DIAGNOSIS — R5383 Other fatigue: Secondary | ICD-10-CM | POA: Diagnosis not present

## 2023-05-17 DIAGNOSIS — R5381 Other malaise: Secondary | ICD-10-CM | POA: Diagnosis not present

## 2023-05-17 DIAGNOSIS — E7800 Pure hypercholesterolemia, unspecified: Secondary | ICD-10-CM | POA: Diagnosis not present

## 2023-05-17 DIAGNOSIS — G720 Drug-induced myopathy: Secondary | ICD-10-CM | POA: Diagnosis not present

## 2023-05-17 DIAGNOSIS — E559 Vitamin D deficiency, unspecified: Secondary | ICD-10-CM | POA: Diagnosis not present

## 2023-05-18 LAB — LAB REPORT - SCANNED: EGFR: 64

## 2023-05-26 ENCOUNTER — Other Ambulatory Visit: Payer: Self-pay | Admitting: Oral Surgery

## 2023-05-26 DIAGNOSIS — D101 Benign neoplasm of tongue: Secondary | ICD-10-CM | POA: Diagnosis not present

## 2023-05-30 LAB — SURGICAL PATHOLOGY

## 2023-06-13 DIAGNOSIS — Z01419 Encounter for gynecological examination (general) (routine) without abnormal findings: Secondary | ICD-10-CM | POA: Diagnosis not present

## 2023-10-10 ENCOUNTER — Other Ambulatory Visit: Payer: Self-pay | Admitting: Registered Nurse

## 2023-10-10 DIAGNOSIS — Z1231 Encounter for screening mammogram for malignant neoplasm of breast: Secondary | ICD-10-CM

## 2023-10-24 ENCOUNTER — Ambulatory Visit

## 2023-11-02 ENCOUNTER — Other Ambulatory Visit: Payer: Self-pay | Admitting: Registered Nurse

## 2023-11-02 ENCOUNTER — Ambulatory Visit
Admission: RE | Admit: 2023-11-02 | Discharge: 2023-11-02 | Disposition: A | Discharge status: Home / Self Care | Source: Ambulatory Visit | Attending: Registered Nurse | Admitting: Registered Nurse

## 2023-11-02 DIAGNOSIS — Z1231 Encounter for screening mammogram for malignant neoplasm of breast: Secondary | ICD-10-CM | POA: Diagnosis not present

## 2023-11-08 DIAGNOSIS — E559 Vitamin D deficiency, unspecified: Secondary | ICD-10-CM | POA: Diagnosis not present

## 2023-11-08 DIAGNOSIS — R739 Hyperglycemia, unspecified: Secondary | ICD-10-CM | POA: Diagnosis not present

## 2023-11-08 DIAGNOSIS — E7800 Pure hypercholesterolemia, unspecified: Secondary | ICD-10-CM | POA: Diagnosis not present

## 2023-11-08 DIAGNOSIS — Z0000 Encounter for general adult medical examination without abnormal findings: Secondary | ICD-10-CM | POA: Diagnosis not present

## 2023-11-08 DIAGNOSIS — R7301 Impaired fasting glucose: Secondary | ICD-10-CM | POA: Diagnosis not present

## 2023-11-09 ENCOUNTER — Encounter: Payer: Self-pay | Admitting: Cardiovascular Disease

## 2023-11-10 ENCOUNTER — Telehealth: Payer: Self-pay | Admitting: Pharmacy Technician

## 2023-11-10 ENCOUNTER — Other Ambulatory Visit (HOSPITAL_COMMUNITY): Payer: Self-pay

## 2023-11-10 NOTE — Telephone Encounter (Signed)
   Pharmacy Patient Advocate Encounter   Received notification from Pt Calls Messages that prior authorization for repatha  is required/requested.   Insurance verification completed.   The patient is insured through [facility] .   Per test claim: PA required; PA submitted to above mentioned insurance via Latent Key/confirmation #/EOC [id number] Status is pending

## 2023-11-13 NOTE — Telephone Encounter (Signed)
   But she hasn't filled since 10/17/22. I will call insurance

## 2023-11-14 LAB — LAB REPORT - SCANNED
A1c: 7.1
EGFR: 67

## 2023-11-14 NOTE — Telephone Encounter (Signed)
 Pharmacy Patient Advocate Encounter  Received notification from [facility] that Prior Authorization for repatha  has been APPROVED from 11/10/23 to 11/09/24   PA #/Case ID/Reference #: [id number]

## 2023-11-14 NOTE — Telephone Encounter (Signed)
 I called the insurance and advised she has not had this filled since 09/2022. They will review this pa as initial now.

## 2023-11-15 ENCOUNTER — Other Ambulatory Visit: Payer: Self-pay | Admitting: Cardiovascular Disease

## 2023-11-15 DIAGNOSIS — Z0000 Encounter for general adult medical examination without abnormal findings: Secondary | ICD-10-CM | POA: Diagnosis not present

## 2023-11-15 DIAGNOSIS — E1165 Type 2 diabetes mellitus with hyperglycemia: Secondary | ICD-10-CM | POA: Diagnosis not present

## 2023-11-15 DIAGNOSIS — E7800 Pure hypercholesterolemia, unspecified: Secondary | ICD-10-CM | POA: Diagnosis not present

## 2023-11-15 DIAGNOSIS — R748 Abnormal levels of other serum enzymes: Secondary | ICD-10-CM | POA: Diagnosis not present

## 2023-11-16 ENCOUNTER — Telehealth: Payer: Self-pay | Admitting: Pharmacy Technician

## 2023-11-16 NOTE — Telephone Encounter (Signed)
 I called the patient and got her a coupon

## 2023-11-28 ENCOUNTER — Encounter: Payer: Self-pay | Admitting: Emergency Medicine

## 2023-11-28 ENCOUNTER — Ambulatory Visit: Attending: Emergency Medicine | Admitting: Emergency Medicine

## 2023-11-28 VITALS — BP 102/64 | HR 72 | Ht 62.0 in | Wt 146.0 lb

## 2023-11-28 DIAGNOSIS — I2510 Atherosclerotic heart disease of native coronary artery without angina pectoris: Secondary | ICD-10-CM | POA: Diagnosis not present

## 2023-11-28 DIAGNOSIS — E119 Type 2 diabetes mellitus without complications: Secondary | ICD-10-CM | POA: Diagnosis not present

## 2023-11-28 DIAGNOSIS — E785 Hyperlipidemia, unspecified: Secondary | ICD-10-CM | POA: Diagnosis not present

## 2023-11-28 MED ORDER — REPATHA SURECLICK 140 MG/ML ~~LOC~~ SOAJ: SUBCUTANEOUS | 5 refills | Status: DC

## 2023-11-28 NOTE — Patient Instructions (Signed)
 Medication Instructions:  NO CHANGES  Lab Work: NONE TO BE DONE TODAY.  Testing/Procedures: NONE  Follow-Up: At [facility], you and your health needs are our priority.  As part of our continuing mission to provide you with exceptional heart care, our providers are all part of one team.  This team includes your primary Cardiologist (physician) and Advanced Practice Providers or APPs (Physician Assistants and Nurse Practitioners) who all work together to provide you with the care you need, when you need it.  Your next appointment:   1 YEAR  Provider:   Maude Emmer, MD

## 2023-11-28 NOTE — Progress Notes (Signed)
 Cardiology Office Note:    Date:  11/28/2023  ID:  Melinda Webster, DOB [birth year], MRN [id number] PCP: Kip Righter, MD  [facility] Providers Cardiologist:  Maude Emmer, MD Cardiology APP:  Rana Lum CROME, NP       Patient Profile:       Chief Complaint: Follow-up for CAD History of Present Illness:  Melinda Webster is a 63 y.o. female with visit-pertinent history of coronary disease, history of breast cancer, hyperlipidemia, insomnia  She had a calcium score done 07/16/2020 with a total 92.6 which is 89th percentile for age and sex mostly in the RCA.  She was last seen in clinic 01/05/2021.  She remains physically active without anginal symptoms.  At that time she was recently seen in the lipid clinic and started on Repatha .  No changes were made, she was to follow-up in 6 months.   Discussed the use of AI scribe software for clinical note transcription with the patient, who gave verbal consent to proceed.  History of Present Illness Melinda Webster is a 63 year old female with coronary artery disease who presents for a follow-up regarding her Repatha  prescription and recent lipid panel results.  She was last seen in 2022, with a coronary CT scan showing coronary plaque in the right and left coronary arteries, placing her in the 89th percentile for her age and sex. She initially discontinued Repatha  a year ago but restarted it three weeks ago, having taken two doses since then. Her recent lipid panel showed an LDL level of 168 mg/dL. She is on Repatha  due to statin intolerance and is concerned about potential gastrointestinal side effects, though she has not experienced any.  She was recently diagnosed with type 2 diabetes and started on metformin.  She maintains an active lifestyle, participating in yoga, weight training, and cardio classes at the [facility]. She experiences no chest pain, syncope, shortness of breath, or other symptoms during physical activity. 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   Review of systems:  Please see the history of present illness. All other systems are reviewed and otherwise negative.      Studies Reviewed:    EKG Interpretation Date/Time:  Tuesday November 28 2023 08:14:33 EDT Ventricular Rate:  72 PR Interval:  116 QRS Duration:  72 QT Interval:  376 QTC Calculation: 411 R Axis:   55  Text Interpretation: Normal sinus rhythm Normal ECG When compared with ECG of 02-Jun-2009 13:21, No significant change was found Confirmed by Rana Lum [phone]) on 11/28/2023 8:48:57 AM    CT cardiac scoring 07/16/2020 IMPRESSION: Coronary calcium score of 92.6. This was 89th percentile for age-, race-, and sex-matched controls. Risk Assessment/Calculations:              Physical Exam:   VS:  BP 102/64 (BP Location: Right Arm, Patient Position: Sitting, Cuff Size: Normal)   Pulse 72   Ht 5' 2 (1.575 m)   Wt 146 lb (66.2 kg)   BMI 26.70 kg/m    Wt Readings from Last 3 Encounters:  11/28/23 146 lb (66.2 kg)  01/06/21 139 lb (63 kg)  01/05/21 140 lb (63.5 kg)    GEN: Well nourished, well developed in no acute distress NECK: No JVD; No carotid bruits CARDIAC: RRR, no murmurs, rubs, gallops RESPIRATORY:  Clear to auscultation without rales, wheezing or rhonchi  ABDOMEN: Soft, non-tender, non-distended EXTREMITIES:  No edema; No acute deformity  Assessment and Plan:  Coronary artery disease CT cardiac scoring 06/2020 with CAC of 92.6 (89th percentile) - EKG today without acute ischemic changes - Today she is without chest pains.  She maintains a very active lifestyle participating in yoga, weight training and cardio classes without any exertional symptoms.  No symptoms to suggest active angina.  No indication for further ischemic evaluation - Continue Repatha  140 mg q. 14-days  Hyperlipidemia, LDL goal <70 LDL 168 on 10/2023 and uncontrolled Past intolerance to  statin therapy Had self discontinued Repatha  x 1 year ago, was not experiencing side effects at that time.  Just restarted back on Repatha  x 3 weeks ago and just completed her second dose and remains without any side effects.  Overall tolerating Repatha  well - Continue Repatha  140 mg q. 14 days - She will have repeat fasting lipid panel with her PCP on 01/2024  T2DM A1c 7.1% on 10/2023 Recently started on metformin 500 mg daily - Recommend DASH diet (high in vegetables, fruits, low-fat dairy products, whole grains, poultry, fish, and nuts and low in sweets, sugar-sweetened beverages, and red meats), salt restriction and increase physical activity.       Dispo:  Return in about 1 year (around 11/27/2024).  Signed, Lum LITTIE Louis, NP

## 2024-02-12 LAB — LAB REPORT - SCANNED
A1c: 6.4
EGFR: 68

## 2024-02-18 DIAGNOSIS — E785 Hyperlipidemia, unspecified: Secondary | ICD-10-CM

## 2024-02-20 ENCOUNTER — Other Ambulatory Visit (HOSPITAL_COMMUNITY): Payer: Self-pay

## 2024-02-20 ENCOUNTER — Telehealth: Payer: Self-pay | Admitting: Pharmacy Technician

## 2024-02-20 NOTE — Telephone Encounter (Signed)
 Pharmacy Patient Advocate Encounter   Received notification from Physician's Office that prior authorization for Repatha  is required/requested.   Insurance verification completed.   The patient is insured through [facility].  Patient also has a coupon if you see 11/16/23 encounter   Per test claim: The current 02/20/24 day co-pay is, $135.00- 3 months.  No PA needed at this time. This test claim was processed through [facility]- copay amounts may vary at other pharmacies due to pharmacy/plan contracts, or as the patient moves through the different stages of their insurance plan.

## 2024-02-21 ENCOUNTER — Other Ambulatory Visit (HOSPITAL_BASED_OUTPATIENT_CLINIC_OR_DEPARTMENT_OTHER): Payer: Self-pay

## 2024-02-21 MED ORDER — REPATHA SURECLICK 140 MG/ML ~~LOC~~ SOAJ
140.0000 mg | SUBCUTANEOUS | 3 refills | Status: AC
  Filled 2024-02-21 – 2024-02-23 (×2): qty 6, 84d supply, fill #0

## 2024-02-21 NOTE — Addendum Note (Signed)
 Addended by: DARRELL BRUCKNER on: 02/21/2024 12:15 PM   Modules accepted: Orders

## 2024-02-23 ENCOUNTER — Other Ambulatory Visit (HOSPITAL_BASED_OUTPATIENT_CLINIC_OR_DEPARTMENT_OTHER): Payer: Self-pay

## 2024-02-23 ENCOUNTER — Other Ambulatory Visit (HOSPITAL_COMMUNITY): Payer: Self-pay
